# Patient Record
Sex: Female | Born: 2000 | Race: White | Hispanic: No | Marital: Single
Health system: Midwestern US, Community
[De-identification: ages and names within clinical notes are randomized; demographics above are authoritative.]

## PROBLEM LIST (undated history)

## (undated) DIAGNOSIS — J45909 Unspecified asthma, uncomplicated: Secondary | ICD-10-CM

## (undated) DIAGNOSIS — G47419 Narcolepsy without cataplexy: Secondary | ICD-10-CM

## (undated) DIAGNOSIS — D649 Anemia, unspecified: Secondary | ICD-10-CM

## (undated) HISTORY — PX: WISDOM TOOTH EXTRACTION: SHX21

---
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
## 2020-03-29 ENCOUNTER — Encounter (HOSPITAL_COMMUNITY): Payer: Self-pay | Admitting: Obstetrics & Gynecology

## 2020-03-29 ENCOUNTER — Inpatient Hospital Stay (HOSPITAL_COMMUNITY): Payer: Self-pay

## 2020-03-29 ENCOUNTER — Inpatient Hospital Stay (HOSPITAL_COMMUNITY)
Admission: AD | Admit: 2020-03-29 | Discharge: 2020-03-29 | Disposition: A | Discharge status: Home / Self Care | Payer: Self-pay | Attending: Obstetrics & Gynecology | Admitting: Obstetrics & Gynecology

## 2020-03-29 ENCOUNTER — Other Ambulatory Visit: Payer: Self-pay

## 2020-03-29 DIAGNOSIS — Z3A01 Less than 8 weeks gestation of pregnancy: Secondary | ICD-10-CM

## 2020-03-29 DIAGNOSIS — O034 Incomplete spontaneous abortion without complication: Secondary | ICD-10-CM

## 2020-03-29 DIAGNOSIS — O4690 Antepartum hemorrhage, unspecified, unspecified trimester: Secondary | ICD-10-CM

## 2020-03-29 HISTORY — DX: Anemia, unspecified: D64.9

## 2020-03-29 HISTORY — DX: Unspecified asthma, uncomplicated: J45.909

## 2020-03-29 HISTORY — DX: Narcolepsy without cataplexy: G47.419

## 2020-03-29 LAB — CBC
HCT: 36.6 % (ref 36.0–46.0)
Hemoglobin: 12.3 g/dL (ref 12.0–15.0)
MCH: 28.1 pg (ref 26.0–34.0)
MCHC: 33.6 g/dL (ref 30.0–36.0)
MCV: 83.8 fL (ref 80.0–100.0)
Platelets: 404 10*3/uL — ABNORMAL HIGH (ref 150–400)
RBC: 4.37 MIL/uL (ref 3.87–5.11)
RDW: 12.9 % (ref 11.5–15.5)
WBC: 9.2 10*3/uL (ref 4.0–10.5)
nRBC: 0 % (ref 0.0–0.2)

## 2020-03-29 LAB — URINALYSIS, ROUTINE W REFLEX MICROSCOPIC
Bacteria, UA: NONE SEEN
Bilirubin Urine: NEGATIVE
Glucose, UA: NEGATIVE mg/dL
Ketones, ur: NEGATIVE mg/dL
Leukocytes,Ua: NEGATIVE
Nitrite: NEGATIVE
Protein, ur: NEGATIVE mg/dL
RBC / HPF: >50 RBC/hpf — ABNORMAL HIGH (ref 0–5)
Specific Gravity, Urine: 1.012 (ref 1.005–1.030)
pH: 5 (ref 5.0–8.0)

## 2020-03-29 LAB — ABO/RH: ABO/RH(D): B NEG

## 2020-03-29 LAB — POCT PREGNANCY, URINE: Preg Test, Ur: POSITIVE — AB

## 2020-03-29 LAB — WET PREP, GENITAL
Clue Cells Wet Prep HPF POC: NONE SEEN
Sperm: NONE SEEN
Trich, Wet Prep: NONE SEEN
Yeast Wet Prep HPF POC: NONE SEEN

## 2020-03-29 LAB — HCG, QUANTITATIVE, PREGNANCY: hCG, Beta Chain, Quant, S: 4804 m[IU]/mL — ABNORMAL HIGH (ref <5)

## 2020-03-29 MED ORDER — RHO D IMMUNE GLOBULIN 1500 UNIT/2ML IJ SOSY
300.0000 ug | PREFILLED_SYRINGE | Freq: Once | INTRAMUSCULAR | Status: AC
  Administered 2020-03-29: 300 ug via INTRAMUSCULAR
  Filled 2020-03-29: qty 2

## 2020-03-29 NOTE — MAU Provider Note (Signed)
History     CSN: [id number]  Arrival date and time: 03/29/20 8841   Event Date/Time   First Provider Initiated Contact with Patient 03/29/20 0327      Chief Complaint  Patient presents with  . Vaginal Bleeding   Jeanne [name] is a 20 y.o. G1P0 at [redacted]w[redacted]d by LMP who presents to MAU with complaints of vaginal bleeding. Patient reports vaginal bleeding started occurring 3 days ago. Initially was like pink spotting then yesterday started to become heavier and bright red. Patient reports soaking through 2 panty liners today. Denies passing any clots. She reports lower abdominal cramping is associated with vaginal bleeding, cramping is specific to RLQ. Patient has not been seen during this pregnancy. Reports last IC 4 days ago.    OB History    Gravida  1   Para      Term      Preterm      AB      Living        SAB      IAB      Ectopic      Multiple      Live Births              Past Medical History:  Diagnosis Date  . Anemia   . Asthma   . Narcolepsy     Past Surgical History:  Procedure Laterality Date  . WISDOM TOOTH EXTRACTION      History reviewed. No pertinent family history.  Social History   Tobacco Use  . Smoking status: Never Smoker  . Smokeless tobacco: Never Used  Substance Use Topics  . Alcohol use: Never  . Drug use: Never    Allergies:  Allergies  Allergen Reactions  . Strawberry (Diagnostic)     Medications Prior to Admission  Medication Sig Dispense Refill Last Dose  . amphetamine-dextroamphetamine (ADDERALL XR) 10 MG 24 hr capsule Take 10 mg by mouth daily.   More than a month at Unknown time  . hydrOXYzine (ATARAX/VISTARIL) 10 MG tablet Take 10 mg by mouth 3 (three) times daily as needed.   More than a month at Unknown time    Review of Systems  Constitutional: Negative.   Respiratory: Negative.   Cardiovascular: Negative.   Gastrointestinal: Positive for abdominal pain. Negative for constipation, diarrhea, nausea  and vomiting.  Genitourinary: Positive for vaginal bleeding. Negative for difficulty urinating, dysuria, frequency, hematuria, pelvic pain and urgency.  Musculoskeletal: Negative.   Neurological: Negative.   Psychiatric/Behavioral: Negative.    Physical Exam   Blood pressure 132/72, pulse 97, temperature 98.6 F (37 C), temperature source Oral, resp. rate 17, height 5\' 4"  (1.626 m), weight 83 kg, last menstrual period 01/18/2020, SpO2 100 %.  Physical Exam Exam conducted with a chaperone present.  Constitutional:      Appearance: Normal appearance.  Cardiovascular:     Rate and Rhythm: Normal rate and regular rhythm.  Pulmonary:     Effort: Pulmonary effort is normal. No respiratory distress.     Breath sounds: Normal breath sounds. No wheezing.  Abdominal:     General: There is no distension.     Palpations: Abdomen is soft. There is no mass.     Tenderness: There is abdominal tenderness. There is no guarding.  Genitourinary:    Exam position: Lithotomy position.     Vagina: Bleeding present.     Comments: Pelvic exam: Cervix pink, visually closed with mucous discharge from cervical os, without lesion, small amount of  bright red vaginal bleeding without clots (2 faux swabs), vaginal walls and external genitalia normal Bimanual exam: Cervix 0/long/high, firm, anterior, neg CMT, uterus nontender, nonenlarged, left adnexa without tenderness, enlargement, or mass,  right adnexa with  Tenderness, no enlargement, or mass palpated  Skin:    General: Skin is warm and dry.  Neurological:     Mental Status: She is alert and oriented to person, place, and time.  Psychiatric:        Mood and Affect: Mood normal.        Behavior: Behavior normal.        Thought Content: Thought content normal.     MAU Course  Procedures  MDM Orders Placed This Encounter  Procedures  . Wet prep, genital  . US OB LESS THAN 14 WEEKS WITH OB TRANSVAGINAL  . CBC  . hCG, quantitative, pregnancy  .  Urinalysis, Routine w reflex microscopic Urine, Clean Catch  . Pregnancy, urine POC  . ABO/Rh   Labs and [place] report reviewed:  Results for orders placed or performed during the hospital encounter of 03/29/20 (from the past 24 hour(s))  Pregnancy, urine POC     Status: Abnormal   Collection Time: 03/29/20  3:00 AM  Result Value Ref Range   Preg Test, Ur POSITIVE (A) NEGATIVE  Wet prep, genital     Status: Abnormal   Collection Time: 03/29/20  3:05 AM   Specimen: Cervix  Result Value Ref Range   Yeast Wet Prep HPF POC NONE SEEN NONE SEEN   Trich, Wet Prep NONE SEEN NONE SEEN   Clue Cells Wet Prep HPF POC NONE SEEN NONE SEEN   WBC, Wet Prep HPF POC MODERATE (A) NONE SEEN   Sperm NONE SEEN   Urinalysis, Routine w reflex microscopic     Status: Abnormal   Collection Time: 03/29/20  3:21 AM  Result Value Ref Range   Color, Urine STRAW (A) YELLOW   APPearance CLEAR CLEAR   Specific Gravity, Urine 1.012 1.005 - 1.030   pH 5.0 5.0 - 8.0   Glucose, UA NEGATIVE NEGATIVE mg/dL   Hgb urine dipstick LARGE (A) NEGATIVE   Bilirubin Urine NEGATIVE NEGATIVE   Ketones, ur NEGATIVE NEGATIVE mg/dL   Protein, ur NEGATIVE NEGATIVE mg/dL   Nitrite NEGATIVE NEGATIVE   Leukocytes,Ua NEGATIVE NEGATIVE   RBC / HPF >50 (H) 0 - 5 RBC/hpf   WBC, UA 0-5 0 - 5 WBC/hpf   Bacteria, UA NONE SEEN NONE SEEN   Squamous Epithelial / LPF 0-5 0 - 5  CBC     Status: Abnormal   Collection Time: 03/29/20  3:26 AM  Result Value Ref Range   WBC 9.2 4.0 - 10.5 K/uL   RBC 4.37 3.87 - 5.11 MIL/uL   Hemoglobin 12.3 12.0 - 15.0 g/dL   HCT 31.4 38.8 - 87.5 %   MCV 83.8 80.0 - 100.0 fL   MCH 28.1 26.0 - 34.0 pg   MCHC 33.6 30.0 - 36.0 g/dL   RDW 79.7 28.2 - 06.0 %   Platelets 404 (H) 150 - 400 K/uL   nRBC 0.0 0.0 - 0.2 %  ABO/Rh     Status: None   Collection Time: 03/29/20  3:26 AM  Result Value Ref Range   ABO/RH(D) B NEG   hCG, quantitative, pregnancy     Status: Abnormal   Collection Time: 03/29/20  3:26 AM   Result Value Ref Range   hCG, Beta Chain, Quant, S 4,804 (H) <5 mIU/mL  Rh IG workup (includes ABO/Rh)     Status: None (Preliminary result)   Collection Time: 03/29/20  3:26 AM  Result Value Ref Range   Gestational Age(Wks) 10    ABO/RH(D) B NEG    Antibody Screen NEG    Unit Number [id number]    Blood Component Type RHIG    Unit division 00    Status of Unit ISSUED    Transfusion Status      OK TO TRANSFUSE Performed at [facility], [address] [place], [place] [address]    US OB LESS THAN 14 WEEKS WITH OB TRANSVAGINAL  Result Date: 03/29/2020 CLINICAL DATA:  Vaginal bleeding EXAM: OBSTETRIC <14 WK US AND TRANSVAGINAL OB US TECHNIQUE: Both transabdominal and transvaginal ultrasound examinations were performed for complete evaluation of the gestation as well as the maternal uterus, adnexal regions, and pelvic cul-de-sac. Transvaginal technique was performed to assess early pregnancy. COMPARISON:  None. FINDINGS: Intrauterine gestational sac: Single Yolk sac:  Not Visualized. Embryo:  Visualized. Cardiac Activity: Not Visualized. CRL:  13.7 mm   7 w   4 d Subchorionic hemorrhage:  None visualized. Maternal uterus/adnexae: Unremarkable IMPRESSION: Single intrauterine pregnancy measuring 7 weeks 4 days (13.407mm CRL). No cardiac activity. Findings meet criteria for failed pregnancy. This follows SRU consensus guidelines: Diagnostic Criteria for Nonviable Pregnancy Early in the First Trimester. Macy Mis Engl J Med [id number];369:1443-51. Electronically Signed   By: Marnee SpringJonathon  Watts M.D.   On: 03/29/2020 04:14   Discussed results of ultrasound and lab work with patient. Diagnosed with incomplete miscarriage today with patient and significant other. Given time for both to grieve then discussed options. Discussed expectant management vs medical management vs surgical management.   Patient request expectant management. Discussed with patient warning signs and reasons to present back to MAU including  fever, heavy vaginal bleeding soaking through pads each hour for at least 2 hours, lightheaded or dizziness. Discussed with patient that follow up appointment will be scheduled in 1 week and if fetus is not passed at that time then medical management with cytotec would be recommended. Patient verbalizes understanding.   RHogam given in MAU prior to discharge.   Meds ordered this encounter  Medications  . rho (d) immune globulin (RHIG/RHOPHYLAC) injection 300 mcg   Discussed reasons to return to MAU. Follow up in the office- message sent to office for scheduling. Return to MAU as needed. Pt stable at time of discharge.   Assessment and Plan   1. Incomplete miscarriage   2. Vaginal bleeding during pregnancy   3. [redacted] weeks gestation of pregnancy    Discharge home Follow up as scheduled in the office Return to MAU as needed for reasons discussed and/or emergencies    Follow-up Information    [facility]. Schedule an appointment as soon as possible for a visit in 1 week(s).   Specialty: Obstetrics and Gynecology Why: Make appointment to be seen in 1 week for follow up  Contact information: [phone] [place] [phone]             Allergies as of 03/29/2020      Reactions   Strawberry (diagnostic)       Medication List    TAKE these medications   amphetamine-dextroamphetamine 10 MG 24 hr capsule Commonly known as: ADDERALL XR Take 10 mg by mouth daily.   hydrOXYzine 10 MG tablet Commonly known as: ATARAX/VISTARIL Take 10 mg by mouth  3 (three) times daily as needed.       Sharyon Cable CNM 03/29/2020, 5:17 AM

## 2020-03-29 NOTE — MAU Note (Signed)
Patient signed printed AVS. Placed in chart. 

## 2020-03-29 NOTE — MAU Note (Signed)
...  Jeanne [name] is a 20 y.o. at approximately [redacted]w[redacted]d here in MAU reporting: vaginal bleeding that began yesterday, 03/28/2020, around 0230. She reports the bleeding is currently light but was heavier yesterday. No clots. Patient reports slight lower middle abdominal discomfort that she describes as "when you eat something bad" that has been intermittent but no pain at this time. Last intercourse 4 days ago.  +UPT LMP: 01/18/2020

## 2020-03-30 LAB — RH IG WORKUP (INCLUDES ABO/RH)
ABO/RH(D): B NEG
Antibody Screen: NEGATIVE
Gestational Age(Wks): 10
Unit division: 0

## 2020-03-30 LAB — GC/CHLAMYDIA PROBE AMP (~~LOC~~) NOT AT ARMC
Chlamydia: NEGATIVE
Comment: NEGATIVE
Comment: NORMAL
Neisseria Gonorrhea: POSITIVE — AB

## 2020-04-01 ENCOUNTER — Other Ambulatory Visit: Payer: Self-pay

## 2020-04-01 ENCOUNTER — Ambulatory Visit (INDEPENDENT_AMBULATORY_CARE_PROVIDER_SITE_OTHER): Payer: Self-pay

## 2020-04-01 VITALS — BP 112/78 | HR 89

## 2020-04-01 DIAGNOSIS — A549 Gonococcal infection, unspecified: Secondary | ICD-10-CM

## 2020-04-01 MED ORDER — CEFTRIAXONE SODIUM 500 MG IJ SOLR
500.0000 mg | Freq: Once | INTRAMUSCULAR | Status: AC
  Administered 2020-04-01: 500 mg via INTRAMUSCULAR

## 2020-04-01 NOTE — Progress Notes (Signed)
Pt here today for treatment for gonorrhea.  Rocephin 500 mg IM.  Pt tolerated well. Pt reports that her partner was not yet treated.  I advised the pt that she can have her partner to go to [facility] for treatment and to make sure that she abstains from intercourse for two weeks after they both have been treated.  Pt verbalized understanding.    Addison Naegeli, RN  04/01/20

## 2020-04-02 NOTE — Progress Notes (Signed)
Patient was assessed and managed by nursing staff during this encounter. I have reviewed the chart and agree with the documentation and plan. I have also made any necessary editorial changes.  [place] Bing, MD 04/02/2020 7:58 AM

## 2020-04-09 ENCOUNTER — Ambulatory Visit (INDEPENDENT_AMBULATORY_CARE_PROVIDER_SITE_OTHER): Payer: Self-pay | Admitting: Advanced Practice Midwife

## 2020-04-09 ENCOUNTER — Encounter: Payer: Self-pay | Admitting: Advanced Practice Midwife

## 2020-04-09 ENCOUNTER — Other Ambulatory Visit: Payer: Self-pay

## 2020-04-09 VITALS — BP 130/71 | HR 86 | Ht 64.0 in | Wt 179.0 lb

## 2020-04-09 DIAGNOSIS — O039 Complete or unspecified spontaneous abortion without complication: Secondary | ICD-10-CM

## 2020-04-09 NOTE — Progress Notes (Signed)
OB PROBLEM CARE ENCOUNTER NOTE  History:     Jeanne [name], prefers to be called "Jeanne [name]". She is a 20 y.o. G1P0 female here for evaluation following diagnosis of incomplete miscarriage in MAU 03/29/2020.  Current complaints: none.   Patient elected for expectant management and reports light but consistent bleeding through the end of February. Denies abnormal vaginal bleeding, discharge, pelvic pain, problems with intercourse or other gynecologic concerns.    Gynecologic History Patient's last menstrual period was 01/18/2020 (exact date). Contraception: none Last Pap: N/A age 67 Last mammogram: N/A age 14  Obstetric History OB History  Gravida Para Term Preterm AB Living  1            SAB IAB Ectopic Multiple Live Births               # Outcome Date GA Lbr Len/2nd Weight Sex Delivery Anes PTL Lv  1 Gravida             Past Medical History:  Diagnosis Date  . Anemia   . Asthma   . Narcolepsy     Past Surgical History:  Procedure Laterality Date  . WISDOM TOOTH EXTRACTION      Current Outpatient Medications on File Prior to Visit  Medication Sig Dispense Refill  . hydrOXYzine (ATARAX/VISTARIL) 10 MG tablet Take 10 mg by mouth 3 (three) times daily as needed.    Marland Kitchen amphetamine-dextroamphetamine (ADDERALL XR) 10 MG 24 hr capsule Take 10 mg by mouth daily. (Patient not taking: Reported on 04/09/2020)     No current facility-administered medications on file prior to visit.    Allergies  Allergen Reactions  . Strawberry (Diagnostic)     Social History:  reports that she has never smoked. She has never used smokeless tobacco. She reports that she does not drink alcohol and does not use drugs.  No family history on file.  The following portions of the patient's history were reviewed and updated as appropriate: allergies, current medications, past family history, past medical history, past social history, past surgical history and problem list.  Review of  Systems Pertinent items noted in HPI and remainder of comprehensive ROS otherwise negative.  Physical Exam:  BP 130/71   Pulse 86   Ht 5\' 4"  (1.626 m)   Wt 179 lb (81.2 kg)   LMP 01/18/2020 (Exact Date)   BMI 30.73 kg/m  CONSTITUTIONAL: Well-developed, well-nourished female in no acute distress.  HENT:  Normocephalic, atraumatic, External right and left ear normal.  EYES: Conjunctivae and EOM are normal. Pupils are equal, round, and reactive to light. No scleral icterus.  SKIN: Skin is warm and dry.  NEUROLOGIC: Alert and oriented to person, place, and time. PSYCHIATRIC: Normal mood and affect. Normal behavior. Normal judgment and thought content. CARDIOVASCULAR: Normal heart rate noted RESPIRATORY: Effort normal  Assessment and Plan:    1. Miscarriage - Quant hCG 4804 on 03/29/2020 - Dx at 7w 5d - Pt elected Expectant Management and has not taken Cytotec - Will assess for Cytotec once quant hCG results are received - Beta hCG quant (ref lab)  2. Contraception Counseling - Previously on OCPs - Reviewed other options for contraception once Quant hCG is received - Strongly encouraged consideration of LARCs - [employer]eds[employer]org utilized as reference during counseling discussion  Routine preventative health maintenance measures emphasized. Please refer to After Visit Summary for other counseling recommendations.   Total visit time: 30 minutes. Greater than 50% of visit spent in counseling and coordination of  care.  Clayton Bibles, MSN, CNM Certified Nurse Midwife, [facility], [facility] 04/09/20 8:16 PM

## 2020-04-09 NOTE — Patient Instructions (Signed)
[facility]  Contraception Choices Contraception refers to things you do or use to prevent pregnancy. It is also called birth control. There are several methods of birth control. Talk to your doctor about the best method for you. Hormonal birth control This kind of birth control uses hormones. Here are some types of hormonal birth control:  A tube that is put under the skin of your arm (implant). The tube can stay in for up to 3 years.  Shots you get every 3 months.  Pills you take every day.  A patch you change 1 time each week for 3 weeks. After that, the patch is taken off for 1 week.  A ring you put in the vagina. The ring is left in for 3 weeks. Then it is taken out of the vagina for 1 week. Then a new ring is put in.  Pills you take after unprotected sex. These are called emergency birth control pills.   Barrier birth control Here are some types of barrier birth control:  A thin covering that is put on the penis before sex (female condom). The covering is thrown away after sex.  A soft, loose covering that is put in the vagina before sex (female condom). The covering is thrown away after sex.  A rubber bowl that sits over the cervix (diaphragm). The bowl must be made for you. The bowl is put into the vagina before sex. The bowl is left in for 6-8 hours after sex. It is taken out within 24 hours.  A small, soft cup that fits over the cervix (cervical cap). The cup must be made for you. The cup should be left in for 6-8 hours after sex. It is taken out within 48 hours.  A sponge that is put into the vagina before sex. It must be left in for at least 6 hours after sex. It must be taken out within 30 hours and thrown away.  A chemical that kills or stops sperm from getting into the womb (uterus). This chemical is called a spermicide. It may be a pill, cream, jelly, or foam to put in the vagina. The chemical should be used at least 10-15 minutes before sex.   IUD birth control IUD  means "intrauterine device." It is put inside the womb. There are two kinds:  Hormone IUD. This kind can stay in the womb for 3-5 years.  Copper IUD. This kind can stay in the womb for 10 years. Permanent birth control Here are some types of permanent birth control:  Surgery to block the fallopian tubes.  Having an insert put into each fallopian tube. This method takes 3 months to work. Other forms of birth control must be used for 3 months.  Surgery to tie off the tubes that carry sperm in men (vasectomy). This method takes 3 months to work. Other forms of birth control must be used for 3 months. Natural planning birth control Here are some types of natural planning birth control:  Not having sex on the days the woman could get pregnant.  Using a calendar: ? To keep track of the length of each menstrual cycle. ? To find out what days pregnancy can happen. ? To plan to not have sex on days when pregnancy can happen.  Watching for signs of ovulation and not having sex during this time. One way the woman can check for ovulation is to check her temperature.  Waiting to have sex until after ovulation. Where to find more information  Centers for Disease Control and Prevention: [employer].br Summary  Contraception, also called birth control, refers to things you do or use to prevent pregnancy.  Hormonal methods of birth control include implants, injections, pills, patches, vaginal rings, and emergency birth control pills.  Barrier methods of birth control can include female condoms, female condoms, diaphragms, cervical caps, sponges, and spermicides.  There are two types of IUD (intrauterine device) birth control. An IUD can be put in a woman's womb to prevent pregnancy for several years.  Permanent birth control can be done through a procedure for males, females, or both. Natural planning means not having sex when the woman could get pregnant. This information is not intended to  replace advice given to you by your health care provider. Make sure you discuss any questions you have with your health care provider. Document Revised: 07/01/2019 Document Reviewed: 07/01/2019 Elsevier Patient Education  2021 [employer].

## 2020-04-10 LAB — BETA HCG QUANT (REF LAB): hCG Quant: 5 m[IU]/mL

## 2020-04-13 ENCOUNTER — Telehealth: Payer: Self-pay

## 2020-04-13 NOTE — Telephone Encounter (Signed)
Calvert Cantor, CNM  P [facility] Can you please notify patient her Sharene Butters hcg is 5 s/p miscarriage, no need to use Cytotec, can start contraception whenever she wants. If she wants an OCP I can send that in for her today. Thanks! SW    Called pt with results. Pt states she would like OCPs. Pharmacy verified. Provider notified to send in prescription.

## 2020-12-18 ENCOUNTER — Ambulatory Visit: Payer: 59 | Admitting: Nurse Practitioner

## 2020-12-18 ENCOUNTER — Encounter: Payer: Self-pay | Admitting: Nurse Practitioner

## 2020-12-18 ENCOUNTER — Other Ambulatory Visit: Payer: Self-pay

## 2020-12-18 VITALS — BP 125/73 | HR 96 | Temp 97.7°F | Resp 20 | Ht 64.0 in | Wt 201.0 lb

## 2020-12-18 DIAGNOSIS — Z3201 Encounter for pregnancy test, result positive: Secondary | ICD-10-CM | POA: Diagnosis not present

## 2020-12-18 DIAGNOSIS — Z3A01 Less than 8 weeks gestation of pregnancy: Secondary | ICD-10-CM | POA: Insufficient documentation

## 2020-12-18 NOTE — Patient Instructions (Signed)
First Trimester of Pregnancy °The first trimester of pregnancy starts on the first day of your last menstrual period until the end of week 12. This is months 1 through 3 of pregnancy. A week after a sperm fertilizes an egg, the egg will implant into the wall of the uterus and begin to develop into a baby. By the end of 12 weeks, all the baby's organs will be formed and the baby will be 2-3 inches in size. °Body changes during your first trimester °Your body goes through many changes during pregnancy. The changes vary and generally return to normal after your baby is born. °Physical changes °You may gain or lose weight. °Your breasts may begin to grow larger and become tender. The tissue that surrounds your nipples (areola) may become darker. °Dark spots or blotches (chloasma or mask of pregnancy) may develop on your face. °You may have changes in your hair. These can include thickening or thinning of your hair or changes in texture. °Health changes °You may feel nauseous, and you may vomit. °You may have heartburn. °You may develop headaches. °You may develop constipation. °Your gums may bleed and may be sensitive to brushing and flossing. °Other changes °You may tire easily. °You may urinate more often. °Your menstrual periods will stop. °You may have a loss of appetite. °You may develop cravings for certain kinds of food. °You may have changes in your emotions from day to day. °You may have more vivid and strange dreams. °Follow these instructions at home: °Medicines °Follow your health care provider's instructions regarding medicine use. Specific medicines may be either safe or unsafe to take during pregnancy. Do not take any medicines unless told to by your health care provider. °Take a prenatal vitamin that contains at least 600 micrograms (mcg) of folic acid. °Eating and drinking °Eat a healthy diet that includes fresh fruits and vegetables, whole grains, good sources of protein such as meat, eggs, or tofu,  and low-fat dairy products. °Avoid raw meat and unpasteurized juice, milk, and cheese. These carry germs that can harm you and your baby. °If you feel nauseous or you vomit: °Eat 4 or 5 small meals a day instead of 3 large meals. °Try eating a few soda crackers. °Drink liquids between meals instead of during meals. °You may need to take these actions to prevent or treat constipation: °Drink enough fluid to keep your urine pale yellow. °Eat foods that are high in fiber, such as beans, whole grains, and fresh fruits and vegetables. °Limit foods that are high in fat and processed sugars, such as fried or sweet foods. °Activity °Exercise only as directed by your health care provider. Most people can continue their usual exercise routine during pregnancy. Try to exercise for 30 minutes at least 5 days a week. °Stop exercising if you develop pain or cramping in the lower abdomen or lower back. °Avoid exercising if it is very hot or humid or if you are at high altitude. °Avoid heavy lifting. °If you choose to, you may have sex unless your health care provider tells you not to. °Relieving pain and discomfort °Wear a good support bra to relieve breast tenderness. °Rest with your legs elevated if you have leg cramps or low back pain. °If you develop bulging veins (varicose veins) in your legs: °Wear support hose as told by your health care provider. °Elevate your feet for 15 minutes, 3-4 times a day. °Limit salt in your diet. °Safety °Wear your seat belt at all times when driving   or riding in a car. °Talk with your health care provider if someone is verbally or physically abusive to you. °Talk with your health care provider if you are feeling sad or have thoughts of hurting yourself. °Lifestyle °Do not use hot tubs, steam rooms, or saunas. °Do not douche. Do not use tampons or scented sanitary pads. °Do not use herbal remedies, alcohol, illegal drugs, or medicines that are not approved by your health care provider. Chemicals  in these products can harm your baby. °Do not use any products that contain nicotine or tobacco, such as cigarettes, e-cigarettes, and chewing tobacco. If you need help quitting, ask your health care provider. °Avoid cat litter boxes and soil used by cats. These carry germs that can cause birth defects in the baby and possibly loss of the unborn baby (fetus) by miscarriage or stillbirth. °General instructions °During routine prenatal visits in the first trimester, your health care provider will do a physical exam, perform necessary tests, and ask you how things are going. Keep all follow-up visits. This is important. °Ask for help if you have counseling or nutritional needs during pregnancy. Your health care provider can offer advice or refer you to specialists for help with various needs. °Schedule a dentist appointment. At home, brush your teeth with a soft toothbrush. Floss gently. °Write down your questions. Take them to your prenatal visits. °Where to find more information °American Pregnancy Association: americanpregnancy.org °American College of Obstetricians and Gynecologists: acog.org/en/Womens%20Health/Pregnancy °Office on Women's Health: womenshealth.gov/pregnancy °Contact a health care provider if you have: °Dizziness. °A fever. °Mild pelvic cramps, pelvic pressure, or nagging pain in the abdominal area. °Nausea, vomiting, or diarrhea that lasts for 24 hours or longer. °A bad-smelling vaginal discharge. °Pain when you urinate. °Known exposure to a contagious illness, such as chickenpox, measles, Zika virus, HIV, or hepatitis. °Get help right away if you have: °Spotting or bleeding from your vagina. °Severe abdominal cramping or pain. °Shortness of breath or chest pain. °Any kind of trauma, such as from a fall or a car crash. °New or increased pain, swelling, or redness in an arm or leg. °Summary °The first trimester of pregnancy starts on the first day of your last menstrual period until the end of week  12 (months 1 through 3). °Eating 4 or 5 small meals a day rather than 3 large meals may help to relieve nausea and vomiting. °Do not use any products that contain nicotine or tobacco, such as cigarettes, e-cigarettes, and chewing tobacco. If you need help quitting, ask your health care provider. °Keep all follow-up visits. This is important. °This information is not intended to replace advice given to you by your health care provider. Make sure you discuss any questions you have with your health care provider. °Document Revised: 07/03/2019 Document Reviewed: 05/09/2019 °Elsevier Patient Education © 2022 [employer] Inc. ° °

## 2020-12-18 NOTE — Progress Notes (Signed)
New Patient Note  RE: Jeanne [name] MRN: [id number] DOB: [birth year] Date of Office Visit: 12/18/2020  Chief Complaint: Establish Care (NEW - pregnant )  History of Present Illness: Patient is a 20 year old female who presents to clinic to establish care.  Patient is newly pregnant less than 8 weeks.  With a history of miscarriage February 2022.  Patient is not reporting any headaches, nausea, vomiting or abdominal pain.  Patient had questions about Rhophylac administration during pregnancy.  Education provided to patient and referral to OB/GYN.  Assessment and Plan: Jeanne [name] is a 20 y.o. female with: Less than [redacted] weeks gestation of pregnancy Assessment completed for early pregnancy.  Completed lab work CBC, CMP, and hCG.  Results pending.  I provided education to patient on signs and symptoms of miscarriage as patient had a history of miscarriage February 2022.  I provided education to patient and spouse for questions about Rhophylac administration.  I completed referral to OB/GYN. Printed handouts given to patient.  Patient knows to follow-up with any unusual symptoms, pain, fever and bleeding.  Return if symptoms worsen or fail to improve.   Diagnostics:   Past Medical History: Patient Active Problem List   Diagnosis Date Noted   Less than [redacted] weeks gestation of pregnancy 12/18/2020   Past Medical History:  Diagnosis Date   Anemia    Asthma    Narcolepsy    Past Surgical History: Past Surgical History:  Procedure Laterality Date   WISDOM TOOTH EXTRACTION     Medication List:  No current outpatient medications on file.   No current facility-administered medications for this visit.   Allergies: Allergies  Allergen Reactions   Strawberry (Diagnostic)    Social History: Social History   Socioeconomic History   Marital status: Married    Spouse name: Dylan   Number of children: Not on file   Years of education: Not on file   Highest education level: Not on file   Occupational History   Not on file  Tobacco Use   Smoking status: Never   Smokeless tobacco: Never  Vaping Use   Vaping Use: Never used  Substance and Sexual Activity   Alcohol use: Never   Drug use: Never   Sexual activity: Yes  Other Topics Concern   Not on file  Social History Narrative   Married - lives in [place]    Social Determinants of Health   Financial Resource Strain: Not on file  Food Insecurity: No Food Insecurity   Worried About Programme researcher, broadcasting/film/video in the Last Year: Never true   Barista in the Last Year: Never true  Transportation Needs: No Transportation Needs   Lack of Transportation (Medical): No   Lack of Transportation (Non-Medical): No  Physical Activity: Not on file  Stress: Not on file  Social Connections: Not on file       Family History: Family History  Problem Relation Age of Onset   Schizophrenia Brother          Review of Systems  Constitutional: Negative.   HENT: Negative.    Respiratory: Negative.    Cardiovascular: Negative.   Gastrointestinal: Negative.   Musculoskeletal: Negative.   Skin:  Negative for rash.  Neurological:  Negative for facial asymmetry and headaches.  Psychiatric/Behavioral:  The patient is not nervous/anxious.   All other systems reviewed and are negative.  Objective: BP 125/73   Pulse 96   Temp 97.7 F (36.5 C)   Resp 20  Ht 5\' 4"  (1.626 m)   Wt 201 lb (91.2 kg)   LMP 11/04/2020 (Exact Date)   SpO2 97%   BMI 34.50 kg/m  Body mass index is 34.5 kg/m.   Physical Exam Vitals and nursing note reviewed.  Constitutional:      Appearance: Normal appearance. She is normal weight.  HENT:     Head: Normocephalic.     Right Ear: Ear canal and external ear normal.     Left Ear: Ear canal and external ear normal.     Mouth/Throat:     Mouth: Mucous membranes are moist.     Pharynx: Oropharynx is clear.  Eyes:     Conjunctiva/sclera: Conjunctivae normal.  Pulmonary:     Effort: Pulmonary  effort is normal.     Breath sounds: Normal breath sounds.  Abdominal:     General: Bowel sounds are normal.  Musculoskeletal:        General: Normal range of motion.  Skin:    General: Skin is warm.     Findings: No rash.  Neurological:     Mental Status: She is alert and oriented to person, place, and time.  Psychiatric:        Mood and Affect: Mood normal.        Behavior: Behavior normal.   The plan was reviewed with the patient/family, and all questions/concerned were addressed.  It was my pleasure to see Jeanne [name] today and participate in her care. Please feel free to contact me with any questions or concerns.  Sincerely,  11/06/2020 NP [facility]

## 2020-12-18 NOTE — Assessment & Plan Note (Signed)
Assessment completed for early pregnancy.  Completed lab work CBC, CMP, and hCG.  Results pending.  I provided education to patient on signs and symptoms of miscarriage as patient had a history of miscarriage February 2022.  I provided education to patient and spouse for questions about Rhophylac administration.  I completed referral to OB/GYN. Printed handouts given to patient.  Patient knows to follow-up with any unusual symptoms, pain, fever and bleeding.

## 2020-12-19 LAB — CBC WITH DIFFERENTIAL/PLATELET
Basophils Absolute: 0.1 10*3/uL (ref 0.0–0.2)
Basos: 1 %
EOS (ABSOLUTE): 0.1 10*3/uL (ref 0.0–0.4)
Eos: 1 %
Hematocrit: 36.6 % (ref 34.0–46.6)
Hemoglobin: 11.9 g/dL (ref 11.1–15.9)
Immature Grans (Abs): 0 10*3/uL (ref 0.0–0.1)
Immature Granulocytes: 0 %
Lymphocytes Absolute: 2.4 10*3/uL (ref 0.7–3.1)
Lymphs: 22 %
MCH: 26.8 pg (ref 26.6–33.0)
MCHC: 32.5 g/dL (ref 31.5–35.7)
MCV: 82 fL (ref 79–97)
Monocytes Absolute: 0.7 10*3/uL (ref 0.1–0.9)
Monocytes: 6 %
Neutrophils Absolute: 7.7 10*3/uL — ABNORMAL HIGH (ref 1.4–7.0)
Neutrophils: 70 %
Platelets: 426 10*3/uL (ref 150–450)
RBC: 4.44 x10E6/uL (ref 3.77–5.28)
RDW: 14 % (ref 11.7–15.4)
WBC: 11 10*3/uL — ABNORMAL HIGH (ref 3.4–10.8)

## 2020-12-19 LAB — COMPREHENSIVE METABOLIC PANEL
ALT: 20 IU/L (ref 0–32)
AST: 18 IU/L (ref 0–40)
Albumin/Globulin Ratio: 2 (ref 1.2–2.2)
Albumin: 4.3 g/dL (ref 3.9–5.0)
Alkaline Phosphatase: 88 IU/L (ref 44–121)
BUN/Creatinine Ratio: 12 (ref 9–23)
BUN: 8 mg/dL (ref 6–20)
Bilirubin Total: <0.2 mg/dL (ref 0.0–1.2)
CO2: 19 mmol/L — ABNORMAL LOW (ref 20–29)
Calcium: 9.1 mg/dL (ref 8.7–10.2)
Chloride: 101 mmol/L (ref 96–106)
Creatinine, Ser: 0.67 mg/dL (ref 0.57–1.00)
Globulin, Total: 2.2 g/dL (ref 1.5–4.5)
Glucose: 170 mg/dL — ABNORMAL HIGH (ref 70–99)
Potassium: 4.2 mmol/L (ref 3.5–5.2)
Sodium: 136 mmol/L (ref 134–144)
Total Protein: 6.5 g/dL (ref 6.0–8.5)
eGFR: 127 mL/min/{1.73_m2} (ref >59)

## 2020-12-19 LAB — HCG, SERUM, QUALITATIVE: hCG,Beta Subunit,Qual,Serum: POSITIVE m[IU]/mL — AB (ref <6)

## 2020-12-27 DIAGNOSIS — J45909 Unspecified asthma, uncomplicated: Secondary | ICD-10-CM | POA: Insufficient documentation

## 2020-12-27 DIAGNOSIS — G47419 Narcolepsy without cataplexy: Secondary | ICD-10-CM | POA: Insufficient documentation

## 2021-01-06 LAB — OB RESULTS CONSOLE RPR: RPR: NONREACTIVE

## 2021-01-06 LAB — OB RESULTS CONSOLE RUBELLA ANTIBODY, IGM: Rubella: NON-IMMUNE/NOT IMMUNE

## 2021-01-06 LAB — HEPATITIS C ANTIBODY: HCV Ab: NEGATIVE

## 2021-01-06 LAB — OB RESULTS CONSOLE HIV ANTIBODY (ROUTINE TESTING): HIV: NONREACTIVE

## 2021-01-06 LAB — OB RESULTS CONSOLE HEPATITIS B SURFACE ANTIGEN: Hepatitis B Surface Ag: NEGATIVE

## 2021-01-19 LAB — OB RESULTS CONSOLE GC/CHLAMYDIA
Chlamydia: NEGATIVE
Neisseria Gonorrhea: NEGATIVE

## 2021-01-20 LAB — HM PAP SMEAR

## 2021-06-16 ENCOUNTER — Encounter: Payer: 59 | Attending: Obstetrics and Gynecology | Admitting: Registered"

## 2021-06-16 DIAGNOSIS — Z3A00 Weeks of gestation of pregnancy not specified: Secondary | ICD-10-CM | POA: Diagnosis not present

## 2021-06-16 DIAGNOSIS — O24419 Gestational diabetes mellitus in pregnancy, unspecified control: Secondary | ICD-10-CM | POA: Insufficient documentation

## 2021-06-18 ENCOUNTER — Encounter: Payer: Self-pay | Admitting: Registered"

## 2021-06-18 DIAGNOSIS — O24419 Gestational diabetes mellitus in pregnancy, unspecified control: Secondary | ICD-10-CM | POA: Insufficient documentation

## 2021-06-18 NOTE — Progress Notes (Signed)
Patient was seen on 06/16/21 for Gestational Diabetes self-management class at the [facility]. The following learning objectives were met by the patient during this course: ? ?States the definition of Gestational Diabetes ?States why dietary management is important in controlling blood glucose ?Describes the effects each nutrient has on blood glucose levels ?Demonstrates ability to create a balanced meal plan ?Demonstrates carbohydrate counting  ?States when to check blood glucose levels ?Demonstrates proper blood glucose monitoring techniques ?States the effect of stress and exercise on blood glucose levels ?States the importance of limiting caffeine and abstaining from alcohol and smoking ? ?Blood glucose monitor given: none. Sample meters out of stock. Used demo to teach how to use Contour Next. ? ?Patient instructed to monitor glucose levels: ?FBS: 60 - <95; 1 hour: <140; 2 hour: <120 ? ?Patient received handouts: ?Nutrition Diabetes and Pregnancy, including carb counting list ? ?Patient will be seen for follow-up as needed. ?

## 2021-07-23 LAB — OB RESULTS CONSOLE GBS: GBS: POSITIVE

## 2021-08-12 ENCOUNTER — Other Ambulatory Visit (HOSPITAL_COMMUNITY): Payer: Self-pay

## 2021-08-13 ENCOUNTER — Inpatient Hospital Stay (HOSPITAL_COMMUNITY): Payer: 59

## 2021-08-13 ENCOUNTER — Inpatient Hospital Stay (HOSPITAL_COMMUNITY)
Admission: AD | Admit: 2021-08-13 | Discharge: 2021-08-16 | DRG: 788 | Disposition: A | Discharge status: Home / Self Care | Payer: 59 | Attending: Obstetrics and Gynecology | Admitting: Obstetrics and Gynecology

## 2021-08-13 ENCOUNTER — Other Ambulatory Visit: Payer: Self-pay

## 2021-08-13 ENCOUNTER — Inpatient Hospital Stay (HOSPITAL_COMMUNITY): Payer: 59 | Admitting: Anesthesiology

## 2021-08-13 ENCOUNTER — Encounter (HOSPITAL_COMMUNITY)
Admission: AD | Disposition: A | Discharge status: Home / Self Care | Payer: Self-pay | Source: Home / Self Care | Attending: Obstetrics and Gynecology

## 2021-08-13 ENCOUNTER — Encounter (HOSPITAL_COMMUNITY): Payer: Self-pay | Admitting: Obstetrics and Gynecology

## 2021-08-13 DIAGNOSIS — O99824 Streptococcus B carrier state complicating childbirth: Secondary | ICD-10-CM | POA: Diagnosis present

## 2021-08-13 DIAGNOSIS — O322XX0 Maternal care for transverse and oblique lie, not applicable or unspecified: Secondary | ICD-10-CM | POA: Diagnosis present

## 2021-08-13 DIAGNOSIS — O24429 Gestational diabetes mellitus in childbirth, unspecified control: Secondary | ICD-10-CM | POA: Diagnosis not present

## 2021-08-13 DIAGNOSIS — Z3A39 39 weeks gestation of pregnancy: Secondary | ICD-10-CM

## 2021-08-13 DIAGNOSIS — O3662X0 Maternal care for excessive fetal growth, second trimester, not applicable or unspecified: Secondary | ICD-10-CM

## 2021-08-13 DIAGNOSIS — O3663X0 Maternal care for excessive fetal growth, third trimester, not applicable or unspecified: Secondary | ICD-10-CM | POA: Diagnosis present

## 2021-08-13 DIAGNOSIS — O24420 Gestational diabetes mellitus in childbirth, diet controlled: Secondary | ICD-10-CM | POA: Diagnosis present

## 2021-08-13 DIAGNOSIS — Z98891 History of uterine scar from previous surgery: Secondary | ICD-10-CM

## 2021-08-13 DIAGNOSIS — O3660X0 Maternal care for excessive fetal growth, unspecified trimester, not applicable or unspecified: Principal | ICD-10-CM | POA: Diagnosis present

## 2021-08-13 LAB — GLUCOSE, CAPILLARY
Glucose-Capillary: 90 mg/dL (ref 70–99)
Glucose-Capillary: 89 mg/dL (ref 70–99)

## 2021-08-13 LAB — CBC
HCT: 35.7 % — ABNORMAL LOW (ref 36.0–46.0)
Hemoglobin: 11.7 g/dL — ABNORMAL LOW (ref 12.0–15.0)
MCH: 25.5 pg — ABNORMAL LOW (ref 26.0–34.0)
MCHC: 32.8 g/dL (ref 30.0–36.0)
MCV: 77.9 fL — ABNORMAL LOW (ref 80.0–100.0)
Platelets: 340 10*3/uL (ref 150–400)
RBC: 4.58 MIL/uL (ref 3.87–5.11)
RDW: 18 % — ABNORMAL HIGH (ref 11.5–15.5)
WBC: 13.6 10*3/uL — ABNORMAL HIGH (ref 4.0–10.5)
nRBC: 0 % (ref 0.0–0.2)

## 2021-08-13 LAB — TYPE AND SCREEN
ABO/RH(D): B NEG
Antibody Screen: NEGATIVE

## 2021-08-13 LAB — RPR: RPR Ser Ql: NONREACTIVE

## 2021-08-13 SURGERY — Surgical Case
Anesthesia: Epidural | Wound class: Clean Contaminated

## 2021-08-13 MED ORDER — COCONUT OIL OIL: 1.0000 | TOPICAL_OIL | Status: DC | PRN

## 2021-08-13 MED ORDER — DIPHENHYDRAMINE HCL 25 MG PO CAPS: 25.0000 mg | ORAL_CAPSULE | ORAL | Status: DC | PRN

## 2021-08-13 MED ORDER — DIPHENHYDRAMINE HCL 25 MG PO CAPS: 25.0000 mg | ORAL_CAPSULE | Freq: Four times a day (QID) | ORAL | Status: DC | PRN

## 2021-08-13 MED ORDER — AMISULPRIDE (ANTIEMETIC) 5 MG/2ML IV SOLN: 10.0000 mg | Freq: Once | INTRAVENOUS | Status: DC | PRN

## 2021-08-13 MED ORDER — NALOXONE HCL 0.4 MG/ML IJ SOLN: 0.4000 mg | INTRAMUSCULAR | Status: DC | PRN

## 2021-08-13 MED ORDER — LACTATED RINGERS IV SOLN
500.0000 mL | INTRAVENOUS | Status: DC | PRN
  Administered 2021-08-13: 500 mL via INTRAVENOUS

## 2021-08-13 MED ORDER — PRENATAL MULTIVITAMIN CH
1.0000 | ORAL_TABLET | Freq: Every day | ORAL | Status: DC
Start: 2021-08-14 — End: 2021-08-16
  Administered 2021-08-14 – 2021-08-15 (×2): 1 via ORAL
  Filled 2021-08-13 (×2): qty 1

## 2021-08-13 MED ORDER — SENNOSIDES-DOCUSATE SODIUM 8.6-50 MG PO TABS
2.0000 | ORAL_TABLET | Freq: Every day | ORAL | Status: DC
Start: 2021-08-14 — End: 2021-08-16
  Administered 2021-08-14 – 2021-08-16 (×3): 2 via ORAL
  Filled 2021-08-13 (×3): qty 2

## 2021-08-13 MED ORDER — ZOLPIDEM TARTRATE 5 MG PO TABS: 5.0000 mg | ORAL_TABLET | Freq: Every evening | ORAL | Status: DC | PRN

## 2021-08-13 MED ORDER — HYDROMORPHONE HCL 1 MG/ML IJ SOLN: 0.2500 mg | INTRAMUSCULAR | Status: DC | PRN

## 2021-08-13 MED ORDER — OXYCODONE HCL 5 MG PO TABS
5.0000 mg | ORAL_TABLET | ORAL | Status: DC | PRN
  Administered 2021-08-15 – 2021-08-16 (×2): 5 mg via ORAL
  Filled 2021-08-13 (×2): qty 1

## 2021-08-13 MED ORDER — TETANUS-DIPHTH-ACELL PERTUSSIS 5-2.5-18.5 LF-MCG/0.5 IM SUSY: 0.5000 mL | PREFILLED_SYRINGE | Freq: Once | INTRAMUSCULAR | Status: DC

## 2021-08-13 MED ORDER — LIDOCAINE-EPINEPHRINE (PF) 2 %-1:200000 IJ SOLN
INTRAMUSCULAR | Status: AC
  Filled 2021-08-13: qty 20

## 2021-08-13 MED ORDER — EPHEDRINE 5 MG/ML INJ: 10.0000 mg | INTRAVENOUS | Status: DC | PRN

## 2021-08-13 MED ORDER — LACTATED RINGERS IV SOLN: 500.0000 mL | Freq: Once | INTRAVENOUS | Status: DC

## 2021-08-13 MED ORDER — OXYTOCIN-SODIUM CHLORIDE 30-0.9 UT/500ML-% IV SOLN
INTRAVENOUS | Status: AC
  Filled 2021-08-13: qty 500

## 2021-08-13 MED ORDER — ONDANSETRON HCL 4 MG/2ML IJ SOLN
INTRAMUSCULAR | Status: AC
  Filled 2021-08-13: qty 2

## 2021-08-13 MED ORDER — OXYCODONE HCL 5 MG PO TABS: 5.0000 mg | ORAL_TABLET | Freq: Once | ORAL | Status: DC | PRN

## 2021-08-13 MED ORDER — KETOROLAC TROMETHAMINE 30 MG/ML IJ SOLN
30.0000 mg | Freq: Four times a day (QID) | INTRAMUSCULAR | Status: AC | PRN
Start: 2021-08-13 — End: 2021-08-14
  Administered 2021-08-13: 30 mg via INTRAVENOUS

## 2021-08-13 MED ORDER — KETOROLAC TROMETHAMINE 30 MG/ML IJ SOLN
INTRAMUSCULAR | Status: AC
  Filled 2021-08-13: qty 1

## 2021-08-13 MED ORDER — SCOPOLAMINE 1 MG/3DAYS TD PT72
MEDICATED_PATCH | TRANSDERMAL | Status: AC
  Filled 2021-08-13: qty 1

## 2021-08-13 MED ORDER — ONDANSETRON HCL 4 MG/2ML IJ SOLN
4.0000 mg | Freq: Once | INTRAMUSCULAR | Status: DC | PRN
Start: 2021-08-13 — End: 2021-08-13

## 2021-08-13 MED ORDER — LIDOCAINE-EPINEPHRINE (PF) 2 %-1:200000 IJ SOLN
INTRAMUSCULAR | Status: DC | PRN
  Administered 2021-08-13 (×2): 5 mL via EPIDURAL
  Administered 2021-08-13: 10 mL via EPIDURAL

## 2021-08-13 MED ORDER — CEFAZOLIN SODIUM-DEXTROSE 2-4 GM/100ML-% IV SOLN
INTRAVENOUS | Status: AC
  Filled 2021-08-13: qty 100

## 2021-08-13 MED ORDER — FENTANYL CITRATE (PF) 100 MCG/2ML IJ SOLN
INTRAMUSCULAR | Status: AC
  Filled 2021-08-13: qty 2

## 2021-08-13 MED ORDER — ONDANSETRON HCL 4 MG/2ML IJ SOLN: 4.0000 mg | Freq: Three times a day (TID) | INTRAMUSCULAR | Status: DC | PRN

## 2021-08-13 MED ORDER — CEFAZOLIN SODIUM-DEXTROSE 2-3 GM-%(50ML) IV SOLR
INTRAVENOUS | Status: DC | PRN
  Administered 2021-08-13: 2 g via INTRAVENOUS

## 2021-08-13 MED ORDER — DIPHENHYDRAMINE HCL 50 MG/ML IJ SOLN: 12.5000 mg | INTRAMUSCULAR | Status: DC | PRN

## 2021-08-13 MED ORDER — OXYCODONE-ACETAMINOPHEN 5-325 MG PO TABS: 1.0000 | ORAL_TABLET | ORAL | Status: DC | PRN

## 2021-08-13 MED ORDER — MENTHOL 3 MG MT LOZG: 1.0000 | LOZENGE | OROMUCOSAL | Status: DC | PRN

## 2021-08-13 MED ORDER — SCOPOLAMINE 1 MG/3DAYS TD PT72
1.0000 | MEDICATED_PATCH | Freq: Once | TRANSDERMAL | Status: DC
  Administered 2021-08-13: 1.5 mg via TRANSDERMAL

## 2021-08-13 MED ORDER — ONDANSETRON HCL 4 MG/2ML IJ SOLN: 4.0000 mg | Freq: Four times a day (QID) | INTRAMUSCULAR | Status: DC | PRN

## 2021-08-13 MED ORDER — SOD CITRATE-CITRIC ACID 500-334 MG/5ML PO SOLN
30.0000 mL | ORAL | Status: DC | PRN
Start: 2021-08-13 — End: 2021-08-13
  Administered 2021-08-13: 30 mL via ORAL
  Filled 2021-08-13: qty 30

## 2021-08-13 MED ORDER — OXYCODONE HCL 5 MG/5ML PO SOLN: 5.0000 mg | Freq: Once | ORAL | Status: DC | PRN

## 2021-08-13 MED ORDER — ACETAMINOPHEN 325 MG PO TABS: 650.0000 mg | ORAL_TABLET | ORAL | Status: DC | PRN

## 2021-08-13 MED ORDER — NALOXONE HCL 4 MG/10ML IJ SOLN: 1.0000 ug/kg/h | INTRAVENOUS | Status: DC | PRN

## 2021-08-13 MED ORDER — SIMETHICONE 80 MG PO CHEW
80.0000 mg | CHEWABLE_TABLET | Freq: Three times a day (TID) | ORAL | Status: DC
  Administered 2021-08-14 – 2021-08-16 (×7): 80 mg via ORAL
  Filled 2021-08-13 (×7): qty 1

## 2021-08-13 MED ORDER — ONDANSETRON HCL 4 MG/2ML IJ SOLN
INTRAMUSCULAR | Status: DC | PRN
  Administered 2021-08-13: 4 mg via INTRAVENOUS

## 2021-08-13 MED ORDER — OXYCODONE-ACETAMINOPHEN 5-325 MG PO TABS: 2.0000 | ORAL_TABLET | ORAL | Status: DC | PRN

## 2021-08-13 MED ORDER — ACETAMINOPHEN 500 MG PO TABS
1000.0000 mg | ORAL_TABLET | Freq: Four times a day (QID) | ORAL | Status: AC
  Administered 2021-08-13 – 2021-08-14 (×4): 1000 mg via ORAL
  Filled 2021-08-13 (×4): qty 2

## 2021-08-13 MED ORDER — MISOPROSTOL 25 MCG QUARTER TABLET
25.0000 ug | ORAL_TABLET | ORAL | Status: DC | PRN
  Administered 2021-08-13: 25 ug via VAGINAL
  Filled 2021-08-13: qty 1

## 2021-08-13 MED ORDER — MORPHINE SULFATE (PF) 0.5 MG/ML IJ SOLN
INTRAMUSCULAR | Status: DC | PRN
  Administered 2021-08-13: 3 mg via EPIDURAL

## 2021-08-13 MED ORDER — SIMETHICONE 80 MG PO CHEW: 80.0000 mg | CHEWABLE_TABLET | ORAL | Status: DC | PRN

## 2021-08-13 MED ORDER — LIDOCAINE HCL (PF) 1 % IJ SOLN: 30.0000 mL | INTRAMUSCULAR | Status: DC | PRN

## 2021-08-13 MED ORDER — LACTATED RINGERS IV SOLN: INTRAVENOUS | Status: DC

## 2021-08-13 MED ORDER — KETOROLAC TROMETHAMINE 30 MG/ML IJ SOLN: 30.0000 mg | Freq: Four times a day (QID) | INTRAMUSCULAR | Status: AC | PRN

## 2021-08-13 MED ORDER — STERILE WATER FOR IRRIGATION IR SOLN
Status: DC | PRN
  Administered 2021-08-13: 1

## 2021-08-13 MED ORDER — FENTANYL CITRATE (PF) 100 MCG/2ML IJ SOLN
INTRAMUSCULAR | Status: DC | PRN
  Administered 2021-08-13: 100 ug via INTRAVENOUS

## 2021-08-13 MED ORDER — OXYTOCIN-SODIUM CHLORIDE 30-0.9 UT/500ML-% IV SOLN: 2.5000 [IU]/h | INTRAVENOUS | Status: DC

## 2021-08-13 MED ORDER — SODIUM BICARBONATE 8.4 % IV SOLN
INTRAVENOUS | Status: AC
  Filled 2021-08-13: qty 50

## 2021-08-13 MED ORDER — PENICILLIN G POTASSIUM 5000000 UNITS IJ SOLR
5.0000 10*6.[IU] | Freq: Once | INTRAMUSCULAR | Status: AC
  Administered 2021-08-13: 5 10*6.[IU] via INTRAVENOUS
  Filled 2021-08-13: qty 5

## 2021-08-13 MED ORDER — SODIUM CHLORIDE 0.9 % IR SOLN
Status: DC | PRN
  Administered 2021-08-13: 1000 mL

## 2021-08-13 MED ORDER — MEPERIDINE HCL 25 MG/ML IJ SOLN: 6.2500 mg | INTRAMUSCULAR | Status: DC | PRN

## 2021-08-13 MED ORDER — PHENYLEPHRINE 80 MCG/ML (10ML) SYRINGE FOR IV PUSH (FOR BLOOD PRESSURE SUPPORT): 80.0000 ug | PREFILLED_SYRINGE | INTRAVENOUS | Status: DC | PRN

## 2021-08-13 MED ORDER — DIBUCAINE (PERIANAL) 1 % EX OINT: 1.0000 | TOPICAL_OINTMENT | CUTANEOUS | Status: DC | PRN

## 2021-08-13 MED ORDER — FENTANYL-BUPIVACAINE-NACL 0.5-0.125-0.9 MG/250ML-% EP SOLN
12.0000 mL/h | EPIDURAL | Status: DC | PRN
  Administered 2021-08-13: 12 mL/h via EPIDURAL
  Filled 2021-08-13: qty 250

## 2021-08-13 MED ORDER — SODIUM CHLORIDE 0.9% FLUSH: 3.0000 mL | INTRAVENOUS | Status: DC | PRN

## 2021-08-13 MED ORDER — OXYTOCIN-SODIUM CHLORIDE 30-0.9 UT/500ML-% IV SOLN
2.5000 [IU]/h | INTRAVENOUS | Status: AC
  Administered 2021-08-13: 2.5 [IU]/h via INTRAVENOUS
  Filled 2021-08-13: qty 500

## 2021-08-13 MED ORDER — TERBUTALINE SULFATE 1 MG/ML IJ SOLN: 0.2500 mg | Freq: Once | INTRAMUSCULAR | Status: DC | PRN

## 2021-08-13 MED ORDER — IBUPROFEN 600 MG PO TABS
600.0000 mg | ORAL_TABLET | Freq: Four times a day (QID) | ORAL | Status: DC
  Administered 2021-08-14 – 2021-08-16 (×9): 600 mg via ORAL
  Filled 2021-08-13 (×9): qty 1

## 2021-08-13 MED ORDER — OXYTOCIN-SODIUM CHLORIDE 30-0.9 UT/500ML-% IV SOLN
1.0000 m[IU]/min | INTRAVENOUS | Status: DC
  Administered 2021-08-13: 200 m[IU]/min via INTRAVENOUS
  Administered 2021-08-13: 2 m[IU]/min via INTRAVENOUS
  Filled 2021-08-13: qty 500

## 2021-08-13 MED ORDER — PENICILLIN G POT IN DEXTROSE 60000 UNIT/ML IV SOLN
3.0000 10*6.[IU] | INTRAVENOUS | Status: DC
  Administered 2021-08-13 (×3): 3 10*6.[IU] via INTRAVENOUS
  Filled 2021-08-13 (×3): qty 50

## 2021-08-13 MED ORDER — WITCH HAZEL-GLYCERIN EX PADS: 1.0000 | MEDICATED_PAD | CUTANEOUS | Status: DC | PRN

## 2021-08-13 MED ORDER — OXYTOCIN BOLUS FROM INFUSION: 333.0000 mL | Freq: Once | INTRAVENOUS | Status: DC

## 2021-08-13 MED ORDER — MORPHINE SULFATE (PF) 0.5 MG/ML IJ SOLN
INTRAMUSCULAR | Status: AC
  Filled 2021-08-13: qty 10

## 2021-08-13 MED ORDER — KETOROLAC TROMETHAMINE 30 MG/ML IJ SOLN: 30.0000 mg | Freq: Once | INTRAMUSCULAR | Status: DC | PRN

## 2021-08-13 SURGICAL SUPPLY — 34 items
APL SKNCLS STERI-STRIP NONHPOA (GAUZE/BANDAGES/DRESSINGS) ×1
BARRIER ADHS 3X4 INTERCEED (GAUZE/BANDAGES/DRESSINGS) IMPLANT
BENZOIN TINCTURE PRP APPL 2/3 (GAUZE/BANDAGES/DRESSINGS) ×1 IMPLANT
BRR ADH 4X3 ABS CNTRL BYND (GAUZE/BANDAGES/DRESSINGS)
CHLORAPREP W/TINT 26ML (MISCELLANEOUS) ×4 IMPLANT
CLAMP CORD UMBIL (MISCELLANEOUS) ×2 IMPLANT
CLOSURE STERI STRIP 1/2 X4 (GAUZE/BANDAGES/DRESSINGS) ×1 IMPLANT
CLOTH BEACON ORANGE TIMEOUT ST (SAFETY) ×2 IMPLANT
DRSG OPSITE POSTOP 4X10 (GAUZE/BANDAGES/DRESSINGS) ×2 IMPLANT
ELECT REM PT RETURN 9FT ADLT (ELECTROSURGICAL)
ELECTRODE REM PT RTRN 9FT ADLT (ELECTROSURGICAL) ×1 IMPLANT
EXTRACTOR VACUUM M CUP 4 TUBE (SUCTIONS) IMPLANT
GLOVE BIO SURGEON STRL SZ 6.5 (GLOVE) ×2 IMPLANT
GLOVE BIOGEL PI IND STRL 7.0 (GLOVE) ×1 IMPLANT
GLOVE BIOGEL PI INDICATOR 7.0 (GLOVE) ×1
GOWN STRL REUS W/TWL LRG LVL3 (GOWN DISPOSABLE) ×4 IMPLANT
KIT ABG SYR 3ML LUER SLIP (SYRINGE) IMPLANT
NDL HYPO 25X5/8 SAFETYGLIDE (NEEDLE) ×1 IMPLANT
NEEDLE HYPO 22GX1.5 SAFETY (NEEDLE) IMPLANT
NEEDLE HYPO 25X5/8 SAFETYGLIDE (NEEDLE) ×2 IMPLANT
NS IRRIG 1000ML POUR BTL (IV SOLUTION) ×2 IMPLANT
PACK C SECTION WH (CUSTOM PROCEDURE TRAY) ×2 IMPLANT
PAD OB MATERNITY 4.3X12.25 (PERSONAL CARE ITEMS) ×2 IMPLANT
SUT CHROMIC 0 CTX 36 (SUTURE) ×4 IMPLANT
SUT PLAIN 0 NONE (SUTURE) IMPLANT
SUT PLAIN 2 0 XLH (SUTURE) IMPLANT
SUT VIC AB 0 CT1 27 (SUTURE) ×8
SUT VIC AB 0 CT1 27XBRD ANBCTR (SUTURE) ×3 IMPLANT
SUT VIC AB 4-0 KS 27 (SUTURE) IMPLANT
SYR CONTROL 10ML LL (SYRINGE) IMPLANT
TOWEL OR 17X24 6PK STRL BLUE (TOWEL DISPOSABLE) ×2 IMPLANT
TRAY FOLEY W/BAG SLVR 14FR LF (SET/KITS/TRAYS/PACK) ×2 IMPLANT
VACUUM CUP M-STYLE MYSTIC II (SUCTIONS) IMPLANT
WATER STERILE IRR 1000ML POUR (IV SOLUTION) ×2 IMPLANT

## 2021-08-13 NOTE — Anesthesia Preprocedure Evaluation (Signed)
Anesthesia Evaluation  Patient identified by MRN, date of birth, ID band Patient awake    Reviewed: Allergy & Precautions, NPO status , Patient's Chart, lab work & pertinent test results  Airway Mallampati: II  TM Distance: >3 FB Neck ROM: Full    Dental no notable dental hx.    Pulmonary asthma ,    Pulmonary exam normal breath sounds clear to auscultation       Cardiovascular negative cardio ROS Normal cardiovascular exam Rhythm:Regular Rate:Normal     Neuro/Psych negative neurological ROS  negative psych ROS   GI/Hepatic negative GI ROS, Neg liver ROS,   Endo/Other  diabetes, Well Controlled, Gestational  Renal/GU negative Renal ROS  negative genitourinary   Musculoskeletal negative musculoskeletal ROS (+)   Abdominal   Peds  Hematology negative hematology ROS (+)   Anesthesia Other Findings IOL for gDM  Reproductive/Obstetrics (+) Pregnancy                             Anesthesia Physical Anesthesia Plan  ASA: 3  Anesthesia Plan: Epidural   Post-op Pain Management:    Induction:   PONV Risk Score and Plan: Treatment may vary due to age or medical condition  Airway Management Planned: Natural Airway  Additional Equipment:   Intra-op Plan:   Post-operative Plan:   Informed Consent: I have reviewed the patients History and Physical, chart, labs and discussed the procedure including the risks, benefits and alternatives for the proposed anesthesia with the patient or authorized representative who has indicated his/her understanding and acceptance.       Plan Discussed with: Anesthesiologist  Anesthesia Plan Comments: (Patient identified. Risks, benefits, options discussed with patient including but not limited to bleeding, infection, nerve damage, paralysis, failed block, incomplete pain control, headache, blood pressure changes, nausea, vomiting, reactions to medication,  itching, and post partum back pain. Confirmed with bedside nurse the patient's most recent platelet count. Confirmed with the patient that they are not taking any anticoagulation, have any bleeding history or any family history of bleeding disorders. Patient expressed understanding and wishes to proceed. All questions were answered. )        Anesthesia Quick Evaluation

## 2021-08-13 NOTE — H&P (Signed)
Jeanne [name] is a 21 y.o. G 2 P 0 at 5 w 1 day presents for IOL secondary to A1DM and GDM.  Received 1 cytotec last night. OB History     Gravida  2   Para      Term      Preterm      AB      Living         SAB      IAB      Ectopic      Multiple      Live Births             Past Medical History:  Diagnosis Date   Anemia    Asthma    Narcolepsy    Past Surgical History:  Procedure Laterality Date   WISDOM TOOTH EXTRACTION     Family History: family history includes Schizophrenia in her brother. Social History:  reports that she has never smoked. She has never used smokeless tobacco. She reports that she does not drink alcohol and does not use drugs.     Maternal Diabetes: Yes:  Diabetes Type:  Diet controlled Genetic Screening: Normal Maternal Ultrasounds/Referrals: Normal Fetal Ultrasounds or other Referrals:  None Maternal Substance Abuse:  No Significant Maternal Medications:  None Significant Maternal Lab Results:  Group B Strep positive Other Comments:  None  Review of Systems  All other systems reviewed and are negative.  Maternal Medical History:  Prenatal Complications - Diabetes: gestational. Diabetes is managed by diet.     Dilation: 2 Effacement (%): 60 Station: -1 Exam by:: Dr Vincente Poli Blood pressure 114/64, pulse 82, temperature 98.2 F (36.8 C), temperature source Oral, resp. rate 16, height 5\' 4"  (1.626 m), weight 93.3 kg, unknown if currently breastfeeding. Maternal Exam:  Uterine Assessment: Contraction strength is mild.  Contraction frequency is irregular.  Abdomen: Fetal presentation: vertex   Fetal Exam Fetal State Assessment: Category I - tracings are normal.   Physical Exam Vitals and nursing note reviewed. Exam conducted with a chaperone present.  Constitutional:      Appearance: Normal appearance.  HENT:     Head: Normocephalic.  Eyes:     Pupils: Pupils are equal, round, and reactive to light.   Cardiovascular:     Rate and Rhythm: Normal rate and regular rhythm.  Neurological:     Mental Status: She is alert.     Prenatal labs: ABO, Rh: --/--/B NEG (07/07 0027) Antibody: NEG (07/07 0027) Rubella: Nonimmune (11/30 0000) RPR: Nonreactive (11/30 0000)  HBsAg: Negative (11/30 0000)  HIV: Non-reactive (11/30 0000)  GBS: Positive/-- (06/16 0000)   Assessment/Plan: IUP at 39 w 1 day LGA A1DM AROM performed Start Pitocin per protocol Epidural Follow labor curve closely  08-24-2000 08/13/2021, 7:39 AM

## 2021-08-13 NOTE — Anesthesia Procedure Notes (Addendum)
Epidural Patient location during procedure: OB Start time: 08/13/2021 10:05 AM End time: 08/13/2021 10:15 AM  Staffing Anesthesiologist: Elmer Picker, MD Performed: anesthesiologist   Preanesthetic Checklist Completed: patient identified, IV checked, risks and benefits discussed, monitors and equipment checked, pre-op evaluation and timeout performed  Epidural Patient position: sitting Prep: DuraPrep and site prepped and draped Patient monitoring: continuous pulse ox, blood pressure, heart rate and cardiac monitor Approach: midline Location: L3-L4 Injection technique: LOR air  Needle:  Needle type: Tuohy  Needle gauge: 17 G Needle length: 9 cm Needle insertion depth: 7 cm Catheter type: closed end flexible Catheter size: 19 Gauge Catheter at skin depth: 12 cm Test dose: negative  Assessment Sensory level: T8 Events: blood not aspirated, injection not painful, no injection resistance, no paresthesia and negative IV test  Additional Notes Patient identified. Risks/Benefits/Options discussed with patient including but not limited to bleeding, infection, nerve damage, paralysis, failed block, incomplete pain control, headache, blood pressure changes, nausea, vomiting, reactions to medication both or allergic, itching and postpartum back pain. Confirmed with bedside nurse the patient's most recent platelet count. Confirmed with patient that they are not currently taking any anticoagulation, have any bleeding history or any family history of bleeding disorders. Patient expressed understanding and wished to proceed. All questions were answered. Sterile technique was used throughout the entire procedure. Please see nursing notes for vital signs. Test dose was given through epidural catheter and negative prior to continuing to dose epidural or start infusion. Warning signs of high block given to the patient including shortness of breath, tingling/numbness in hands, complete motor block,  or any concerning symptoms with instructions to call for help. Patient was given instructions on fall risk and not to get out of bed. All questions and concerns addressed with instructions to call with any issues or inadequate analgesia.  Reason for block:procedure for pain

## 2021-08-13 NOTE — Progress Notes (Signed)
Patient complete since 3 15 pm Pushing for over an hour Exam c/c/+0 Caput  Head has not moved at all Recommend Primary LTCS Risks reviewed She and her husband will discuss

## 2021-08-13 NOTE — Transfer of Care (Signed)
Immediate Anesthesia Transfer of Care Note  Patient: Jeanne [name]  Procedure(s) Performed: CESAREAN SECTION  Patient Location: PACU  Anesthesia Type:Epidural  Level of Consciousness: awake, alert , oriented and patient cooperative  Airway & Oxygen Therapy: Patient Spontanous Breathing  Post-op Assessment: Report given to RN and Post -op Vital signs reviewed and stable  Post vital signs: Reviewed and stable  Last Vitals:  Vitals Value Taken Time  BP 136/78 08/13/21 1940  Temp 36.8 C 08/13/21 1940  Pulse 102 08/13/21 1944  Resp 24 08/13/21 1944  SpO2 100 % 08/13/21 1944  Vitals shown include unvalidated device data.  Last Pain:  Vitals:   08/13/21 1940  TempSrc: Oral  PainSc:          Complications: No notable events documented.

## 2021-08-13 NOTE — Brief Op Note (Signed)
08/13/2021  7:22 PM  PATIENT:  Jeanne [name]  21 y.o. female  PRE-OPERATIVE DIAGNOSIS:   IUP at 30 w 1 day LGA A1DM Arrest of Descent  POST-OPERATIVE DIAGNOSIS:   above  PROCEDURE:  Procedure(s): CESAREAN SECTION (N/A)  SURGEON:  Surgeon(s) and Role:    * Marcelle Overlie, MD - Primary  PHYSICIAN ASSISTANT:   ASSISTANTS: none   ANESTHESIA:   epidural  EBL:  per anesthesia record  BLOOD ADMINISTERED:none  DRAINS: Urinary Catheter (Foley)   LOCAL MEDICATIONS USED:  NONE  SPECIMEN:  No Specimen  DISPOSITION OF SPECIMEN:  N/A  COUNTS:  YES  TOURNIQUET:  * No tourniquets in log *  DICTATION: .Other Dictation: Dictation Number dictated  PLAN OF CARE: Admit to inpatient   PATIENT DISPOSITION:  PACU - hemodynamically stable.   Delay start of Pharmacological VTE agent (>24hrs) due to surgical blood loss or risk of bleeding: not applicable

## 2021-08-13 NOTE — Lactation Note (Signed)
This note was copied from a baby's chart. Lactation Consultation Note Baby needed to feed before glucose lab drawn. LC woke baby up. LC had hand expressed colostrum in spoon and gave to baby. Baby took colostrum well. Placed baby at mom's breast in laid back football hold d/t mom slightly dizzy and pale. Mom stated she is always pale. Mom stated Lt. Nipple hurt when baby BF on it earlier but not on the Rt. Lt. Nipple red. Baby latched well w/LC using t-cup hold. One baby started suckling well LC let go and baby maintained latch himself. Noted milk transfer. LC giving mom hand pump and shells to wear in am to assist in everting nipples more. Suggest to pre-pump before latching. Newborn feeding habits, STS, I&O, positioning, support, supply and demand reviewed. Mom encouraged to feed baby 8-12 times/24 hours and with feeding cues.   Encouraged to call for assistance or questions.   Plan: Wear shells in bra in am. Pre-pump w/hand pump before latching. Use finger stimulation if needed to evert nipple more before latching. Call for assistance  as needed.  Patient Name: Jeanne [name] OMBTD'H Date: 08/13/2021 Reason for consult: Initial assessment;Primapara;Term;Maternal endocrine disorder Age:7 hours  Maternal Data Has patient been taught Hand Expression?: Yes Does the patient have breastfeeding experience prior to this delivery?: No  Feeding    LATCH Score Latch: Repeated attempts needed to sustain latch, nipple held in mouth throughout feeding, stimulation needed to elicit sucking reflex.  Audible Swallowing: A few with stimulation  Type of Nipple: Everted at rest and after stimulation (short shaft/compressible)  Comfort (Breast/Nipple): Filling, red/small blisters or bruises, mild/mod discomfort (Lt. nipple red)  Hold (Positioning): Assistance needed to correctly position infant at breast and maintain latch.  LATCH Score: 6   Lactation Tools Discussed/Used     Interventions Interventions: Breast feeding basics reviewed;Assisted with latch;Skin to skin;Breast massage;Hand express;Breast compression;Adjust position;Support pillows;Position options;Expressed milk;LC Services brochure;Hand pump;Shells  Discharge    Consult Status Consult Status: Follow-up Date: 08/14/21 Follow-up type: In-patient    Charyl Dancer 08/13/2021, 11:21 PM

## 2021-08-13 NOTE — Op Note (Signed)
Jeanne, [name] MEDICAL RECORD NO: [id number] ACCOUNT NO: [id number] DATE OF BIRTH: Nov 21, 2000 FACILITY: [facility] LOCATION: [facility] PHYSICIAN: Letitia Sabala L. Vincente Poli, MD  Operative Report   DATE OF PROCEDURE: 08/13/2021  PREOPERATIVE DIAGNOSES:  Intrauterine pregnancy at 39 weeks and 1 day, induction of labor, arrest of descent, A1 diabetes and large for gestational age.  POSTOPERATIVE DIAGNOSES:  Intrauterine pregnancy at 39 weeks and 1 day, induction of labor, arrest of descent, A1 diabetes and large for gestational age.  PROCEDURE:  Primary low transverse cesarean section.  SURGEON:  Darrol Brandenburg L. Vincente Poli, MD.  ANESTHESIA:  Per anesthesia record, epidural.  ESTIMATED BLOOD LOSS:  Per anesthesia record.  COMPLICATIONS:  None.  PATHOLOGY:  None.  DESCRIPTION OF PROCEDURE:  The patient was taken to the operating room after she was consented about the risk of the procedure.  Her epidural was dosed.  It was found to be adequate.  A Foley catheter had been inserted while on labor and delivery.   Timeout was performed.  She was prepped and draped.  Allis test was performed.  A low transverse incision was made, carried down to the fascia.  Fascia was scored in the midline and extended laterally.  The rectus muscles were separated in the midline.   The peritoneum was entered bluntly.  The peritoneal incision was then extended.  Bladder blade was inserted and the bladder flap was created sharply and then digitally.  The bladder blade was then readjusted.  A low transverse incision was made in the  uterus.  Uterus was entered using a hemostat.  The baby was in occiput transverse position and was rotated and delivered easily, was a female infant and Apgars 9 at one minute and 9 at five minutes and weighed 9 pounds and 13 ounces The baby was handed off to the neonatal team.  The  cord was clamped and cut prior to that. Cord blood was obtained.  The placenta was manually removed and noted to be normal  intact with a 3-vessel cord.  Uterus was exteriorized and appeared unremarkable.  Uterine incision was closed after the uterus was  cleared of all clots and debris. It was closed in 2 layers using 0 chromic in a running locked stitch.  The uterus was returned to the abdomen.  Irrigation was performed.  The incision was carefully inspected and noted to be hemostatic.  The peritoneum  was closed using 0 Vicryl in a running stitch.  The fascia was closed using 0 Vicryl running stitch.  The subcutaneous was closed with plain gut suture interrupted.  The skin was closed with 3-0 Vicryl on a Keith needle.  Benzoin, Steri-Strips and  honeycomb dressing were applied.  All sponge, lap and instrument counts were correct the patient went to recovery room in stable condition.   PAA D: 08/13/2021 7:30:42 pm T: 08/13/2021 10:05:00 pm  JOB: [id number]/ [id number]

## 2021-08-13 NOTE — Anesthesia Postprocedure Evaluation (Signed)
Anesthesia Post Note  Patient: Jeanne [name]  Procedure(s) Performed: CESAREAN SECTION     Patient location during evaluation: PACU Anesthesia Type: Epidural Level of consciousness: oriented and awake and alert Pain management: pain level controlled Vital Signs Assessment: post-procedure vital signs reviewed and stable Respiratory status: spontaneous breathing and respiratory function stable Cardiovascular status: blood pressure returned to baseline and stable Postop Assessment: no headache, no backache, no apparent nausea or vomiting and epidural receding Anesthetic complications: no   No notable events documented.  Last Vitals:  Vitals:   08/13/21 1945 08/13/21 2000  BP: 132/74 119/68  Pulse: 100 90  Resp: (!) 30 19  Temp:    SpO2: 100% 100%    Last Pain:  Vitals:   08/13/21 1945  TempSrc:   PainSc: 0-No pain   Pain Goal:                Epidural/Spinal Function Cutaneous sensation: Tingles (08/13/21 2000), Patient able to flex knees: No (08/13/21 2000), Patient able to lift hips off bed: No (08/13/21 2000), Back pain beyond tenderness at insertion site: No (08/13/21 2000), Progressively worsening motor and/or sensory loss: No (08/13/21 2000), Bowel and/or bladder incontinence post epidural: No (08/13/21 2000)  Tennis Must [place]

## 2021-08-14 ENCOUNTER — Encounter (HOSPITAL_COMMUNITY): Payer: Self-pay | Admitting: Obstetrics and Gynecology

## 2021-08-14 LAB — CBC
HCT: 29.8 % — ABNORMAL LOW (ref 36.0–46.0)
Hemoglobin: 9.5 g/dL — ABNORMAL LOW (ref 12.0–15.0)
MCH: 26.1 pg (ref 26.0–34.0)
MCHC: 31.9 g/dL (ref 30.0–36.0)
MCV: 81.9 fL (ref 80.0–100.0)
Platelets: 244 10*3/uL (ref 150–400)
RBC: 3.64 MIL/uL — ABNORMAL LOW (ref 3.87–5.11)
RDW: 18.1 % — ABNORMAL HIGH (ref 11.5–15.5)
WBC: 11.1 10*3/uL — ABNORMAL HIGH (ref 4.0–10.5)
nRBC: 0 % (ref 0.0–0.2)

## 2021-08-14 NOTE — Lactation Note (Signed)
This note was copied from a baby's chart. Lactation Consultation Note  Patient Name: Jeanne [name] TIWPY'K Date: 08/14/2021 Reason for consult: Follow-up assessment;Mother's request;Difficult latch;Term (weight loss -1% weight loss) Age:22 hours P1, term female infant, -1% weight loss. Mom is breast and bottle feeding due difficult latch. LC ask mom express small amount colostrum out prior to latch, mom latched infant on her right breast using the football hold position, infant after few attempts sustained latch and BF for 15 minutes, mom switched to her left breast and infant BF for another 15 minutes total feeding at breast was 30 minutes. Afterwards mom bottle feed infant 15 mls of formula. Mom will continue to BF infant according to feeding cues, 8 to 12+ times within 24 hours, skin to skin. Mom will continue to ask for latch assistance if needed.  Maternal Data    Feeding Mother's Current Feeding Choice: Breast Milk and Formula Nipple Type: Slow - flow  LATCH Score Latch: Grasps breast easily, tongue down, lips flanged, rhythmical sucking.  Audible Swallowing: Spontaneous and intermittent  Type of Nipple: Everted at rest and after stimulation (Mom has areola edema was given breast shells)  Comfort (Breast/Nipple): Soft / non-tender  Hold (Positioning): Assistance needed to correctly position infant at breast and maintain latch.  LATCH Score: 9   Lactation Tools Discussed/Used Tools: Shells  Interventions Interventions: Assisted with latch;Skin to skin;Adjust position;Support pillows;Position options;Shells;Education  Discharge    Consult Status Consult Status: Follow-up Date: 08/15/21 Follow-up type: In-patient    Danelle Earthly 08/14/2021, 8:49 PM

## 2021-08-14 NOTE — Progress Notes (Signed)
POD # 1  Doing well No complaints BP 124/68 (BP Location: Right Arm)   Pulse 84   Temp 98.9 F (37.2 C) (Oral)   Resp 20   Ht 5\' 4"  (1.626 m)   Wt 93.3 kg   SpO2 98%   Breastfeeding Unknown   BMI 35.29 kg/m  Results for orders placed or performed during the hospital encounter of 08/13/21 (from the past 24 hour(s))  Glucose, capillary     Status: None   Collection Time: 08/13/21  8:06 PM  Result Value Ref Range   Glucose-Capillary 89 70 - 99 mg/dL   Comment 1 Notify RN   CBC     Status: Abnormal   Collection Time: 08/14/21  4:46 AM  Result Value Ref Range   WBC 11.1 (H) 4.0 - 10.5 K/uL   RBC 3.64 (L) 3.87 - 5.11 MIL/uL   Hemoglobin 9.5 (L) 12.0 - 15.0 g/dL   HCT 10/15/21 (L) 10.2 - 58.5 %   MCV 81.9 80.0 - 100.0 fL   MCH 26.1 26.0 - 34.0 pg   MCHC 31.9 30.0 - 36.0 g/dL   RDW 27.7 (H) 82.4 - 23.5 %   Platelets 244 150 - 400 K/uL   nRBC 0.0 0.0 - 0.2 %   Abdomen is soft and non tender Bandage clean and dry and intact  Pod # 1  Doing well Circ today or tomorrow Discharge home tomorrow

## 2021-08-14 NOTE — Lactation Note (Signed)
This note was copied from a baby's chart. Lactation Consultation Note  Patient Name: Jeanne [name] LKHVF'M Date: 08/14/2021   Age:22 hours Mom had family visiting she will call LC for next latch assistance, infant was recently given 20 mls of formula prior to [facility] entering the room. [facility] written her name on the [employer].  Maternal Data    Feeding Nipple Type: Slow - flow  LATCH Score Lactation Tools Discussed/Used    Interventions    Discharge    Consult Status      Danelle Earthly 08/14/2021, 6:17 PM

## 2021-08-14 NOTE — Progress Notes (Signed)
CSW received consult for hx of panic attacks.  CSW met with MOB to offer support and complete assessment, MOB accompanied by FOB and female guest. MOB granted CSW verbal permission to speak in front of FOB and female guest about anything.CSW introduced self and explained reason for consult, MOB was welcoming, polite, and remained engaged during assessment.  CSW and MOB discussed MOB's mental health history. MOB reported that she was diagnosed with anxiety/panic attacks in 2016 or 2017.  MOB denied any current symptoms and was unable to recall the last time she experienced symptoms. MOB shared that she has not had a panic attack in a couple years.   MOB reported that she is not taking any medication nor participating in therapy to treat anxiety. MOB denied needing any mental health resources. MOB denied any additional mental health history. CSW inquired about how MOB was feeling emotionally since giving birth, MOB reported that she was feeling happy. MOB presented calm and did not demonstrate any acute mental health signs/symptoms. CSW assessed for safety, MOB denied SI and HI. CSW did not assess for domestic violence as FOB was present. CSW inquired about MOB's support system, MOB reported that Husband/FOB and FOB's family are supports.   CSW provided education regarding the baby blues period vs. perinatal mood disorders, discussed treatment and gave resources for mental health follow up if concerns arise.  CSW recommends self-evaluation during the postpartum time period using the New Mom Checklist from Postpartum Progress and encouraged MOB to contact a medical professional if symptoms are noted at any time.    CSW provided review of Sudden Infant Death Syndrome (SIDS) precautions. MOB verbalized understanding and reported having all needed items to care for infant including a car seat, crib, pack and  play, and basinet. CSW asked if parents were interested in community supports parenting education programs,  parents agreed. CSW informed parents about the Healthy Start program, parents agreeable to referral.   CSW will complete Healthy Start referral.   CSW identifies no further need for intervention and no barriers to discharge at this time.  Abundio Miu, [place] Worker [facility] Cell#: [phone]

## 2021-08-15 NOTE — Lactation Note (Signed)
This note was copied from a baby's chart. Lactation Consultation Note  Patient Name: Jeanne [name] OPFYT'W Date: 08/15/2021 Reason for consult: Follow-up assessment;Mother's request;Difficult latch;Term;Infant weight loss;Breastfeeding assistance Age:22 hours LC went to assist with breastfeeding with the help of NS. Parents stated infant recent feeding of 42 mls of ( breast, then formula). Infant resting comfortably.  LC provided 20 NS and instructed parents to call for latch assistance with next feeding.   LC encouraged Mom to pump after each feeding for 15 mins with DEBP.  Mom started washing parts and will get on a pumping regimen once they are dry.  All questions answered at the end of the visit.  Maternal Data    Feeding Mother's Current Feeding Choice: Breast Milk and Formula  LATCH Score                    Lactation Tools Discussed/Used Tools: Pump;Flanges Flange Size: 24 Breast pump type: Double-Electric Breast Pump Pump Education: Setup, frequency, and cleaning;Milk Storage Reason for Pumping: increase stimulation Pumping frequency: post pump after feeding for 15 mins  Interventions Interventions: Breast feeding basics reviewed;Expressed milk;DEBP;Education;Pace feeding;LC Psychologist, educational;Infant Driven Feeding Algorithm education  Discharge Pump: DEBP  Consult Status Consult Status: Follow-up Date: 08/16/21 Follow-up type: In-patient    Jeanne Limbach  [name] 08/15/2021, 10:21 PM

## 2021-08-15 NOTE — Progress Notes (Signed)
POD # 2 doing well. BP 128/79 (BP Location: Right Arm)   Pulse 73   Temp 97.7 F (36.5 C) (Oral)   Resp 16   Ht 5\' 4"  (1.626 m)   Wt 93.3 kg   SpO2 98%   Breastfeeding Unknown   BMI 35.29 kg/m  No results found for this or any previous visit (from the past 24 hour(s)). Bandage clean and dry  Uterus non tender POD # 2  Doing well Discharge home tomorrow Circ today - parents consented

## 2021-08-15 NOTE — Lactation Note (Signed)
This note was copied from a baby's chart. Lactation Consultation Note  Patient Name: Jeanne [name] Date: 08/15/2021 Reason for consult: Follow-up assessment;Mother's request;Primapara;1st time breastfeeding Age:22 hours   P1 mother whose infant is now 80 hours old.  This is a term baby at 39+1 weeks.  Mother's current feeding preference is breast/formula.  Mother requested latch assistance.  Baby "Aiden" had a circumcision this morning and was asleep STS on mother's chest when I arrived.  She was interested in trying to latch.  Attempted to awaken baby by providing colostrum drops on my finger.  Multiple drops given, however, he remained too sleepy.  Placed him back STS.  Reassurance given and suggested mother call her RN/LC for latch assistance at the next feeding.  Reviewed breast feeding basics with parents.  They will benefit from further education and support with the next feeding.  RN updated.   Maternal Data Has patient been taught Hand Expression?: Yes Does the patient have breastfeeding experience prior to this delivery?: No  Feeding Mother's Current Feeding Choice: Breast Milk and Formula Nipple Type: Slow - flow  LATCH Score Latch: Too sleepy or reluctant, no latch achieved, no sucking elicited.  Audible Swallowing: None  Type of Nipple: Everted at rest and after stimulation (Short shafted)  Comfort (Breast/Nipple): Soft / non-tender  Hold (Positioning): Assistance needed to correctly position infant at breast and maintain latch.  LATCH Score: 5   Lactation Tools Discussed/Used    Interventions Interventions: Breast feeding basics reviewed;Assisted with latch;Skin to skin;Breast massage;Hand express;Breast compression;Expressed milk;Position options;Support pillows;Adjust position;Education  Discharge Pump: Personal  Consult Status Consult Status: Follow-up Date: 08/16/21 Follow-up type: In-patient    Auburn Hester R Izza Bickle 08/15/2021, 12:06  PM

## 2021-08-16 MED ORDER — IBUPROFEN 600 MG PO TABS: 600.0000 mg | ORAL_TABLET | Freq: Four times a day (QID) | ORAL | 0 refills | Status: DC

## 2021-08-16 MED ORDER — MEASLES, MUMPS & RUBELLA VAC IJ SOLR
0.5000 mL | Freq: Once | INTRAMUSCULAR | Status: DC
  Filled 2021-08-16: qty 0.5

## 2021-08-16 MED ORDER — OXYCODONE HCL 5 MG PO TABS: 5.0000 mg | ORAL_TABLET | Freq: Four times a day (QID) | ORAL | 0 refills | Status: DC | PRN

## 2021-08-16 NOTE — Lactation Note (Signed)
This note was copied from a baby's chart. Lactation Consultation Note  Patient Name: [name] Date: 08/16/2021 Reason for consult: Follow-up assessment Age:22 hours  P1, 7.79%.  Family is breastfeeding, pumping and supplementing with formula. Attempted to latch but baby was sleepy. Discussed continuing to offer breast first then supplement. Mother is pumping 9-10 ml. Reviewed engorgement care and monitoring voids/stools. Feed on demand with cues.  Goal 8-12+ times per day after first 24 hrs.  Place baby STS if not cueing.   Feeding Mother's Current Feeding Choice: Breast Milk and Formula   Interventions Interventions: Breast feeding basics reviewed;Assisted with latch  Discharge Discharge Education: Engorgement and breast care;Warning signs for feeding baby Pump: DEBP;Personal (DEBP)  Consult Status Consult Status: Complete Date: 08/16/21    Dahlia Byes [facility] 08/16/2021, 11:08 AM

## 2021-08-16 NOTE — Progress Notes (Signed)
Patient is going to check with her neurologist to make sure MMR is ok to get. Her neurologist previously told her to avoid certain vaccines and patient is unsure if MMR is one. Will get later if cleared.

## 2021-08-16 NOTE — Discharge Summary (Signed)
Postpartum Discharge Summary       Patient Name: Jeanne [name] DOB: [birth year] MRN: [id number]  Date of admission: 08/13/2021 Delivery date:08/13/2021  Delivering provider: Dian Queen  Date of discharge: 08/16/2021  Admitting diagnosis: Large for gestational age fetus affecting management of mother [O36.60X0] S/P cesarean section [Z98.891] Intrauterine pregnancy: [redacted]w[redacted]d    Secondary diagnosis:  Principal Problem:   Large for gestational age fetus affecting management of mother Active Problems:   S/P cesarean section  Additional problems:  A1DM    Discharge diagnosis: Term Pregnancy Delivered, GDM A1, and LGA                                               Post partum procedures:   Augmentation: N/A Complications: None  Hospital course: Sceduled C/S   21y.o. yo G2P1001 at 32w1das admitted to the hospital 08/13/2021 for scheduled cesarean section with the following indication:Elective Primary and LGA and A1DM .Delivery details are as follows:  Membrane Rupture Time/Date: 7:32 AM ,08/13/2021   Delivery Method:C-Section, Low Transverse  Details of operation can be found in separate operative note.  Patient had an uncomplicated postpartum course.  She is ambulating, tolerating a regular diet, passing flatus, and urinating well. Patient is discharged home in stable condition on  08/16/21        Newborn Data: Birth date:08/13/2021  Birth time:6:55 PM  Gender:Female  Living status:Living  Apgars:8 ,9  Weight:4430 g     Magnesium Sulfate received: No BMZ received: No Rhophylac:N/A MMR:Yes T-DaP:Given prenatally Flu: N/A Transfusion:No  Physical exam  Vitals:   08/15/21 0553 08/15/21 1300 08/15/21 2200 08/16/21 0500  BP: 128/79 125/61 132/84 137/74  Pulse: 73 84 94 73  Resp:  18 18 20   Temp: 97.7 F (36.5 C) 97.9 F (36.6 C) 98.3 F (36.8 C) 98.1 F (36.7 C)  TempSrc: Oral Oral Oral Oral  SpO2: 98%  98% 98%  Weight:      Height:       General: alert,  cooperative, and no distress Lochia: appropriate Uterine Fundus: firm Incision: Healing well with no significant drainage DVT Evaluation: No evidence of DVT seen on physical exam. Labs: Lab Results  Component Value Date   WBC 11.1 (H) 08/14/2021   HGB 9.5 (L) 08/14/2021   HCT 29.8 (L) 08/14/2021   MCV 81.9 08/14/2021   PLT 244 08/14/2021      Latest Ref Rng & Units 12/18/2020   10:22 AM  CMP  Glucose 70 - 99 mg/dL 170   BUN 6 - 20 mg/dL 8   Creatinine 0.57 - 1.00 mg/dL 0.67   Sodium 134 - 144 mmol/L 136   Potassium 3.5 - 5.2 mmol/L 4.2   Chloride 96 - 106 mmol/L 101   CO2 20 - 29 mmol/L 19   Calcium 8.7 - 10.2 mg/dL 9.1   Total Protein 6.0 - 8.5 g/dL 6.5   Total Bilirubin 0.0 - 1.2 mg/dL <0.2   Alkaline Phos 44 - 121 IU/L 88   AST 0 - 40 IU/L 18   ALT 0 - 32 IU/L 20    Edinburgh Score:    08/14/2021    4:39 AM  Edinburgh Postnatal Depression Scale Screening Tool  I have been able to laugh and see the funny side of things. 0  I have looked forward with enjoyment to things.  0  I have blamed myself unnecessarily when things went wrong. 2  I have been anxious or worried for no good reason. 1  I have felt scared or panicky for no good reason. 0  Things have been getting on top of me. 1  I have been so unhappy that I have had difficulty sleeping. 0  I have felt sad or miserable. 0  I have been so unhappy that I have been crying. 1  The thought of harming myself has occurred to me. 0  Edinburgh Postnatal Depression Scale Total 5      After visit meds:  Allergies as of 08/16/2021       Reactions   Strawberry (diagnostic)         Medication List     TAKE these medications    ibuprofen 600 MG tablet Commonly known as: ADVIL Take 1 tablet (600 mg total) by mouth every 6 (six) hours.   oxyCODONE 5 MG immediate release tablet Commonly known as: Oxy IR/ROXICODONE Take 1 tablet (5 mg total) by mouth every 6 (six) hours as needed for moderate pain.   prenatal  multivitamin Tabs tablet Take 1 tablet by mouth daily at 12 noon.         Discharge home in stable condition Infant Feeding: Breast Infant Disposition:home with mother Discharge instruction: per After Visit Summary and Postpartum booklet. Activity: Advance as tolerated. Pelvic rest for 6 weeks.  Diet: carb modified diet Anticipated Birth Control: Unsure Postpartum Appointment:6 weeks Additional Postpartum F/U:    Future Appointments:No future appointments. Follow up Visit:      08/16/2021 Luz Lex, MD

## 2021-08-17 ENCOUNTER — Encounter (HOSPITAL_COMMUNITY): Payer: Self-pay | Admitting: Obstetrics and Gynecology

## 2021-08-17 ENCOUNTER — Inpatient Hospital Stay (HOSPITAL_COMMUNITY)
Admission: AD | Admit: 2021-08-17 | Discharge: 2021-08-17 | Disposition: A | Discharge status: Home / Self Care | Payer: 59 | Attending: Obstetrics and Gynecology | Admitting: Obstetrics and Gynecology

## 2021-08-17 DIAGNOSIS — O909 Complication of the puerperium, unspecified: Secondary | ICD-10-CM | POA: Insufficient documentation

## 2021-08-17 DIAGNOSIS — O165 Unspecified maternal hypertension, complicating the puerperium: Secondary | ICD-10-CM

## 2021-08-17 DIAGNOSIS — Z6791 Unspecified blood type, Rh negative: Secondary | ICD-10-CM | POA: Insufficient documentation

## 2021-08-17 DIAGNOSIS — R109 Unspecified abdominal pain: Secondary | ICD-10-CM | POA: Diagnosis not present

## 2021-08-17 DIAGNOSIS — Z862 Personal history of diseases of the blood and blood-forming organs and certain disorders involving the immune mechanism: Secondary | ICD-10-CM | POA: Insufficient documentation

## 2021-08-17 LAB — CBC
HCT: 29.1 % — ABNORMAL LOW (ref 36.0–46.0)
Hemoglobin: 9.2 g/dL — ABNORMAL LOW (ref 12.0–15.0)
MCH: 25.8 pg — ABNORMAL LOW (ref 26.0–34.0)
MCHC: 31.6 g/dL (ref 30.0–36.0)
MCV: 81.7 fL (ref 80.0–100.0)
Platelets: 353 10*3/uL (ref 150–400)
RBC: 3.56 MIL/uL — ABNORMAL LOW (ref 3.87–5.11)
RDW: 18.4 % — ABNORMAL HIGH (ref 11.5–15.5)
WBC: 12.5 10*3/uL — ABNORMAL HIGH (ref 4.0–10.5)
nRBC: 0 % (ref 0.0–0.2)

## 2021-08-17 LAB — COMPREHENSIVE METABOLIC PANEL
ALT: 47 U/L — ABNORMAL HIGH (ref 0–44)
AST: 41 U/L (ref 15–41)
Albumin: 2.4 g/dL — ABNORMAL LOW (ref 3.5–5.0)
Alkaline Phosphatase: 107 U/L (ref 38–126)
Anion gap: 9 (ref 5–15)
BUN: 10 mg/dL (ref 6–20)
CO2: 22 mmol/L (ref 22–32)
Calcium: 8.5 mg/dL — ABNORMAL LOW (ref 8.9–10.3)
Chloride: 108 mmol/L (ref 98–111)
Creatinine, Ser: 0.7 mg/dL (ref 0.44–1.00)
GFR, Estimated: >60 mL/min (ref >60)
Glucose, Bld: 93 mg/dL (ref 70–99)
Potassium: 3.7 mmol/L (ref 3.5–5.1)
Sodium: 139 mmol/L (ref 135–145)
Total Bilirubin: 0.2 mg/dL — ABNORMAL LOW (ref 0.3–1.2)
Total Protein: 5.5 g/dL — ABNORMAL LOW (ref 6.5–8.1)

## 2021-08-17 LAB — URINALYSIS, ROUTINE W REFLEX MICROSCOPIC
Bacteria, UA: NONE SEEN
Bilirubin Urine: NEGATIVE
Glucose, UA: NEGATIVE mg/dL
Ketones, ur: NEGATIVE mg/dL
Leukocytes,Ua: NEGATIVE
Nitrite: NEGATIVE
Protein, ur: NEGATIVE mg/dL
Specific Gravity, Urine: 1.006 (ref 1.005–1.030)
pH: 6 (ref 5.0–8.0)

## 2021-08-17 MED ORDER — NIFEDIPINE ER OSMOTIC RELEASE 30 MG PO TB24
30.0000 mg | ORAL_TABLET | Freq: Every day | ORAL | Status: DC
  Administered 2021-08-17: 30 mg via ORAL
  Filled 2021-08-17: qty 1

## 2021-08-17 MED ORDER — NIFEDIPINE ER 30 MG PO TB24: 30.0000 mg | ORAL_TABLET | Freq: Every day | ORAL | 1 refills | Status: DC

## 2021-08-17 NOTE — MAU Note (Signed)
.  Jeanne [name] is a 21 y.o. PP C/S in MAU reporting: pt woke up 1915 to go the bathroom felt a sharp ABD/pelvic pain, more internal like her bladder. Pt stated she was not able to barely get up and move due to intense pain, but forced herself to to void around 1945. Pt states the pain stopped after she went to the bathroom. Pt's husband called EMS to evaluate her, they checked her BP and it was elevated 170/100. Pt had no previous issues with elevated BP during delivery. Pt had her C/S last Friday, and has been taking it easy.   Onset of complaint: 1915 Pain score: 0/10 Vitals:   08/17/21 2056 08/17/21 2104  BP: (!) 157/81 (!) 157/81  Pulse: (!) 109 95  Resp:  18  Temp:  97.9 F (36.6 C)  SpO2:  99%      Lab orders placed from triage:

## 2021-08-17 NOTE — MAU Provider Note (Cosign Needed Addendum)
History     [id number]  Arrival date and time: 08/17/21 2052    Chief Complaint  Patient presents with   Hypertension   Abdominal Pain     HPI Jeanne [name] is a 21 y.o. at 4 days post partum who presents via EMS for abdominal pain & hypertension. She had a primary c/section on 7/7 due  Reports episode of bladder pain when she woke up this evening. Pain resolved after voiding. Patient realized that she hadn't urinated since 6 am this morning. Denies fever, n/v/d, dysuria, hematuria.  Took her BP & it was elevated. Denies headache, visual disturbance, or epigastric pain. Denies history of hypertension.    OB History     Gravida  2   Para  1   Term  1   Preterm      AB  1   Living  1      SAB  1   IAB      Ectopic      Multiple  0   Live Births  1           Past Medical History:  Diagnosis Date   Anemia    Asthma    Narcolepsy     Past Surgical History:  Procedure Laterality Date   CESAREAN SECTION N/A 08/13/2021   Procedure: CESAREAN SECTION;  Surgeon: Marcelle Overlie, MD;  Location: [facility]S;  Service: Obstetrics;  Laterality: N/A;   WISDOM TOOTH EXTRACTION      Family History  Problem Relation Age of Onset   Schizophrenia Brother     Allergies  Allergen Reactions   Strawberry (Diagnostic)     No current facility-administered medications on file prior to encounter.   Current Outpatient Medications on File Prior to Encounter  Medication Sig Dispense Refill   ibuprofen (ADVIL) 600 MG tablet Take 1 tablet (600 mg total) by mouth every 6 (six) hours. 30 tablet 0   oxyCODONE (OXY IR/ROXICODONE) 5 MG immediate release tablet Take 1 tablet (5 mg total) by mouth every 6 (six) hours as needed for moderate pain. 10 tablet 0   Prenatal Vit-Fe Fumarate-FA (PRENATAL MULTIVITAMIN) TABS tablet Take 1 tablet by mouth daily at 12 noon.       ROS Pertinent positives and negative per HPI, all others reviewed and negative  Physical Exam   BP (!)  141/81   Pulse 95   Temp 97.9 F (36.6 C) (Oral)   Resp 18   SpO2 100%   Breastfeeding Yes   Patient Vitals for the past 24 hrs:  BP Temp Temp src Pulse Resp SpO2  08/17/21 2215 (!) 141/81 -- -- 95 -- 100 %  08/17/21 2200 136/76 -- -- 98 -- 100 %  08/17/21 2145 136/74 -- -- 95 -- 99 %  08/17/21 2115 133/80 -- -- 93 -- 99 %  08/17/21 2104 (!) 157/81 97.9 F (36.6 C) Oral 95 18 99 %  08/17/21 2100 -- -- -- -- -- 99 %  08/17/21 2056 (!) 157/81 -- -- (!) 109 -- --    Physical Exam Vitals and nursing note reviewed.  Constitutional:      General: She is not in acute distress.    Appearance: She is well-developed. She is not diaphoretic.  Pulmonary:     Effort: Pulmonary effort is normal. No respiratory distress.  Abdominal:     Palpations: Abdomen is soft.     Tenderness: There is no abdominal tenderness.     Comments: Honeycomb dressing in  place, small amount of old blood staining dressing.   Skin:    General: Skin is warm and dry.     Coloration: Skin is pale.  Neurological:     Mental Status: She is alert.     Labs Results for orders placed or performed during the hospital encounter of 08/17/21 (from the past 24 hour(s))  Urinalysis, Routine w reflex microscopic Urine, Clean Catch     Status: Abnormal   Collection Time: 08/17/21  9:34 PM  Result Value Ref Range   Color, Urine STRAW (A) YELLOW   APPearance CLEAR CLEAR   Specific Gravity, Urine 1.006 1.005 - 1.030   pH 6.0 5.0 - 8.0   Glucose, UA NEGATIVE NEGATIVE mg/dL   Hgb urine dipstick MODERATE (A) NEGATIVE   Bilirubin Urine NEGATIVE NEGATIVE   Ketones, ur NEGATIVE NEGATIVE mg/dL   Protein, ur NEGATIVE NEGATIVE mg/dL   Nitrite NEGATIVE NEGATIVE   Leukocytes,Ua NEGATIVE NEGATIVE   RBC / HPF 0-5 0 - 5 RBC/hpf   WBC, UA 0-5 0 - 5 WBC/hpf   Bacteria, UA NONE SEEN NONE SEEN   Squamous Epithelial / LPF 0-5 0 - 5  CBC     Status: Abnormal   Collection Time: 08/17/21  9:43 PM  Result Value Ref Range   WBC 12.5 (H)  4.0 - 10.5 K/uL   RBC 3.56 (L) 3.87 - 5.11 MIL/uL   Hemoglobin 9.2 (L) 12.0 - 15.0 g/dL   HCT 42.6 (L) 83.4 - 19.6 %   MCV 81.7 80.0 - 100.0 fL   MCH 25.8 (L) 26.0 - 34.0 pg   MCHC 31.6 30.0 - 36.0 g/dL   RDW 22.2 (H) 97.9 - 89.2 %   Platelets 353 150 - 400 K/uL   nRBC 0.0 0.0 - 0.2 %  Comprehensive metabolic panel     Status: Abnormal   Collection Time: 08/17/21  9:43 PM  Result Value Ref Range   Sodium 139 135 - 145 mmol/L   Potassium 3.7 3.5 - 5.1 mmol/L   Chloride 108 98 - 111 mmol/L   CO2 22 22 - 32 mmol/L   Glucose, Bld 93 70 - 99 mg/dL   BUN 10 6 - 20 mg/dL   Creatinine, Ser 1.19 0.44 - 1.00 mg/dL   Calcium 8.5 (L) 8.9 - 10.3 mg/dL   Total Protein 5.5 (L) 6.5 - 8.1 g/dL   Albumin 2.4 (L) 3.5 - 5.0 g/dL   AST 41 15 - 41 U/L   ALT 47 (H) 0 - 44 U/L   Alkaline Phosphatase 107 38 - 126 U/L   Total Bilirubin 0.2 (L) 0.3 - 1.2 mg/dL   GFR, Estimated >41 >74 mL/min   Anion gap 9 5 - 15   MAU Course  Procedures Lab Orders         CBC         Comprehensive metabolic panel         Urinalysis, Routine w reflex microscopic Urine, Clean Catch    Meds ordered this encounter  Medications   NIFEdipine (PROCARDIA-XL/NIFEDICAL-XL) 24 hr tablet 30 mg   NIFEdipine (ADALAT CC) 30 MG 24 hr tablet    Sig: Take 1 tablet (30 mg total) by mouth daily.    Dispense:  30 tablet    Refill:  1    Order Specific Question:   Supervising Provider    Answer:   Reva Bores [[id number]]   Imaging Orders  No imaging studies ordered today    MDM Labs  collected Care turned over to [facility] CNM Judeth Horn, NP 08/17/2021 9:56 PM   Results reviewed with Dr. Macon Large- recommends starting Procardia and BP check at the end of the week.   Plan of care reviewed with patient and partner. First dose of Procardia given before discharge.   Dr. Elon Spanner notified of patient being started on antihypertensives and need for BP check- MD instructed patient to call office tomorrow to make  appointment.  Assessment and Plan   1. Postpartum hypertension    -Discharge home in stable condition -Rx for Procardia sent to patient's pharmacy -Preeclampsia precautions discussed -Patient advised to follow-up with OB this week for a BP check -Patient may return to MAU as needed or if her condition were to change or worsen  Rolm Bookbinder, CNM 08/17/21 10:43 PM

## 2021-08-21 ENCOUNTER — Telehealth (HOSPITAL_COMMUNITY): Payer: Self-pay

## 2021-08-21 NOTE — Telephone Encounter (Signed)
Patient reports feeling good. She states that she took her honeycomb dressing off and her incision looks good. Patient declines questions/concerns about her health and healing.  Patient reports that baby is doing well. Eating, peeing/pooping, and gaining weight well. Baby sleeps in a bassinet. RN reviewed ABC's of safe sleep with patient. Patient declines any questions or concerns about baby.  EPDS score is 4.  Marcelino Duster [facility]  08/21/2021,1129

## 2021-08-23 ENCOUNTER — Inpatient Hospital Stay (HOSPITAL_COMMUNITY): Payer: 59

## 2022-07-10 IMAGING — US US OB < 14 WEEKS - US OB TV
1 series · 15 of 28 positions shown · non-contrast
Comparison: None.

CLINICAL DATA: Vaginal bleeding

EXAM:
OBSTETRIC <14 WK US AND TRANSVAGINAL OB US
TECHNIQUE: Both transabdominal and transvaginal ultrasound examinations were
performed for complete evaluation of the gestation as well as the
maternal uterus, adnexal regions, and pelvic cul-de-sac.
Transvaginal technique was performed to assess early pregnancy.

[Series 1: us ob < 14 weeks - us ob tv · 35 acquisitions, 15 frames shown]
[im 1/35]
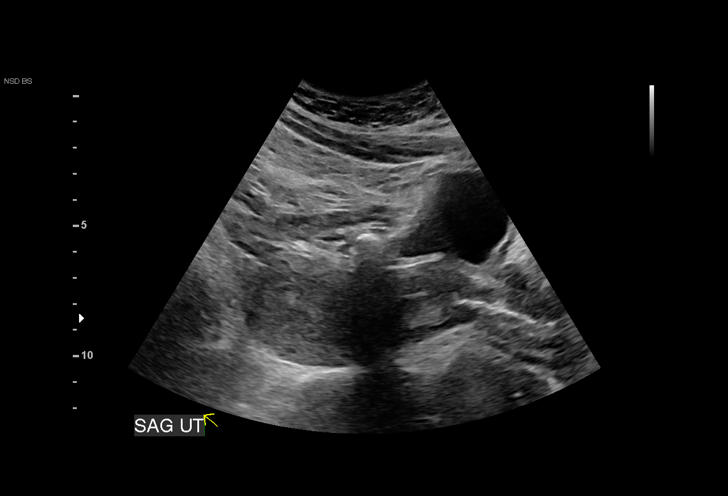
[im 3/35]
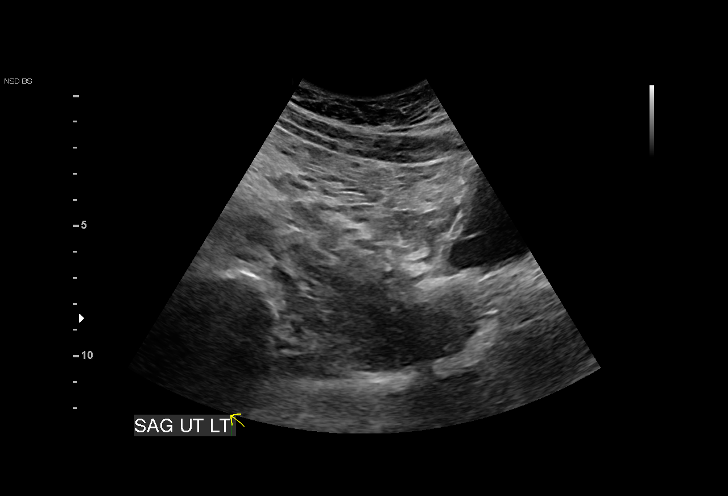
[im 6/35]
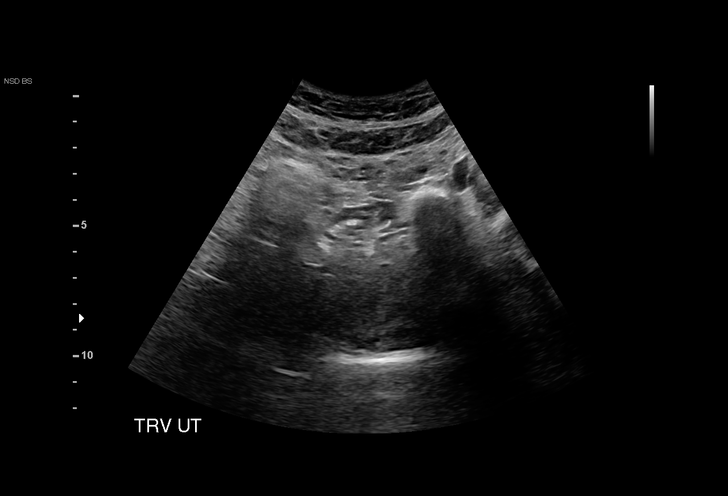
[im 8/35]
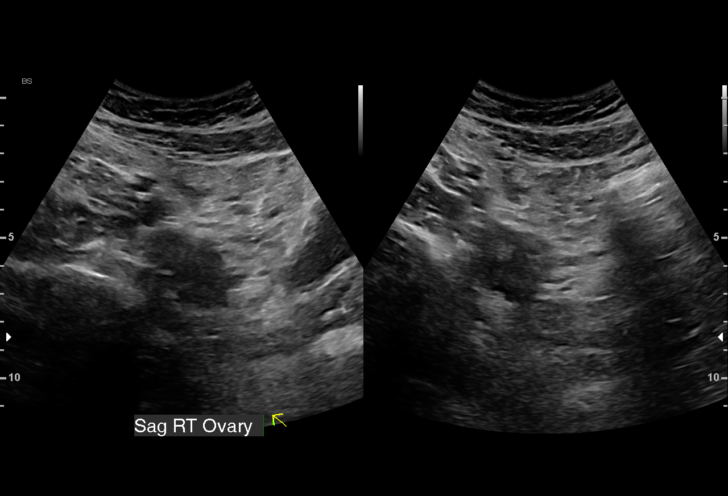
[im 11/35]
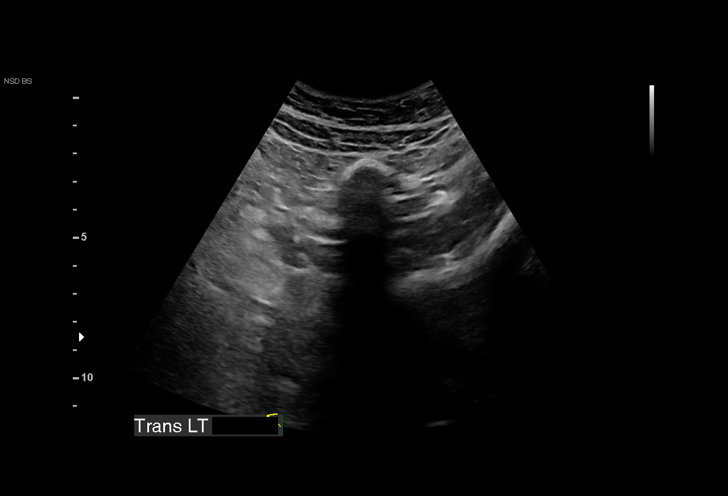
[im 13/35]
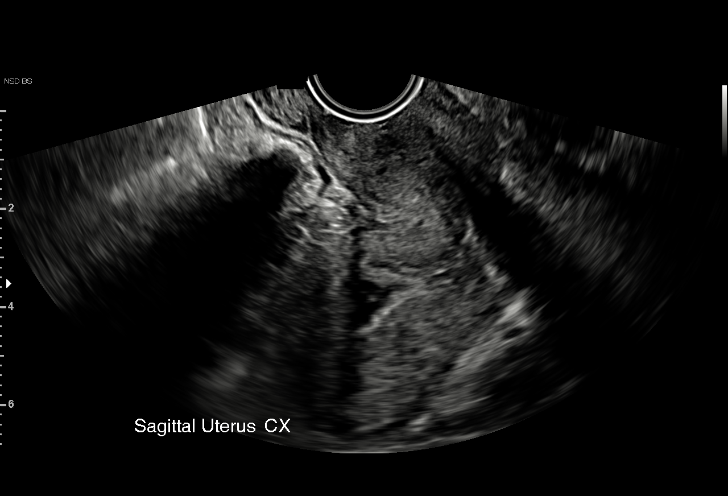
[im 16/35]
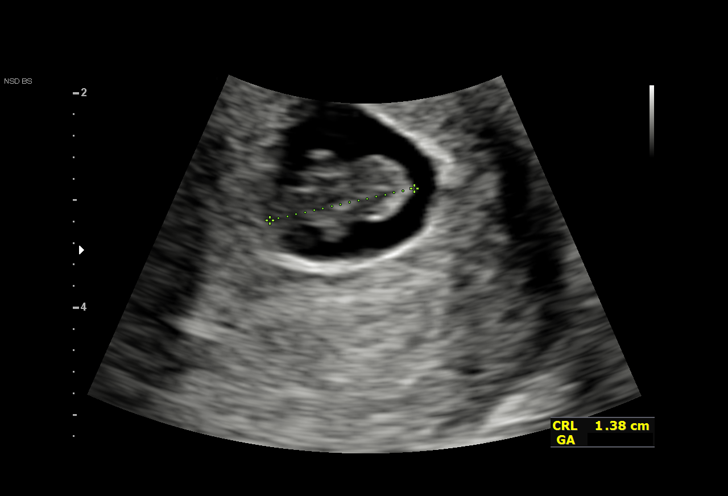
[im 18/35]
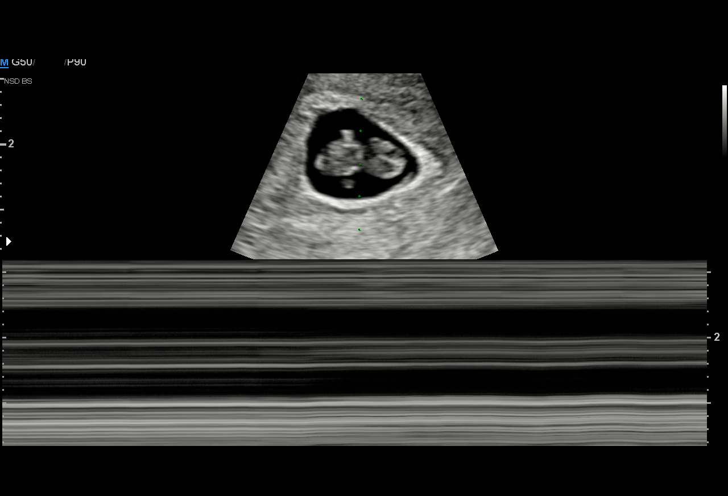
[im 19/35]
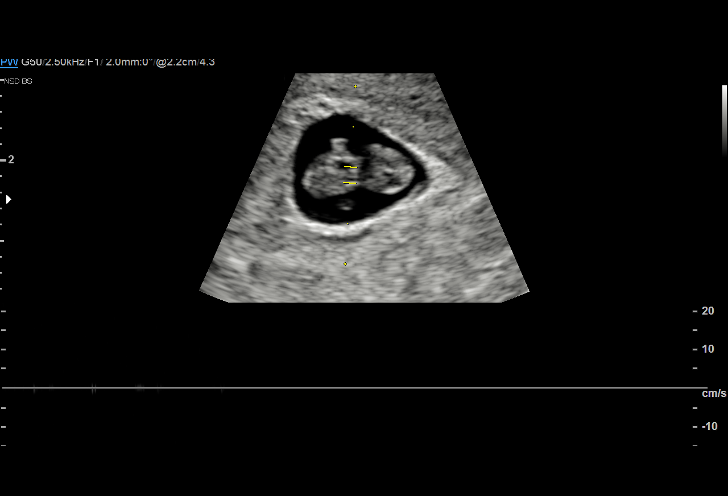
[im 22/35]
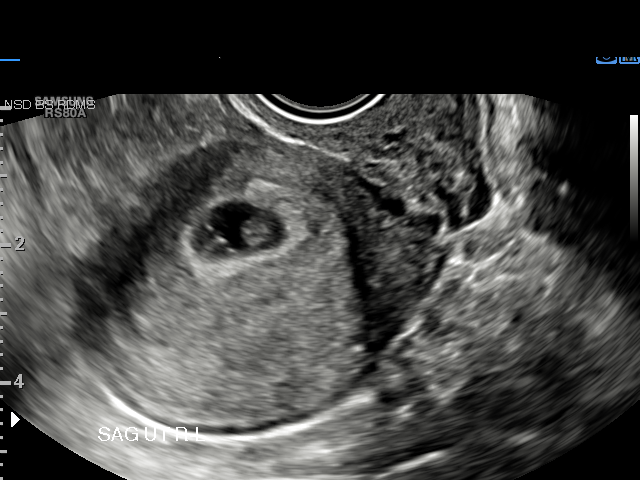
[im 24/35]
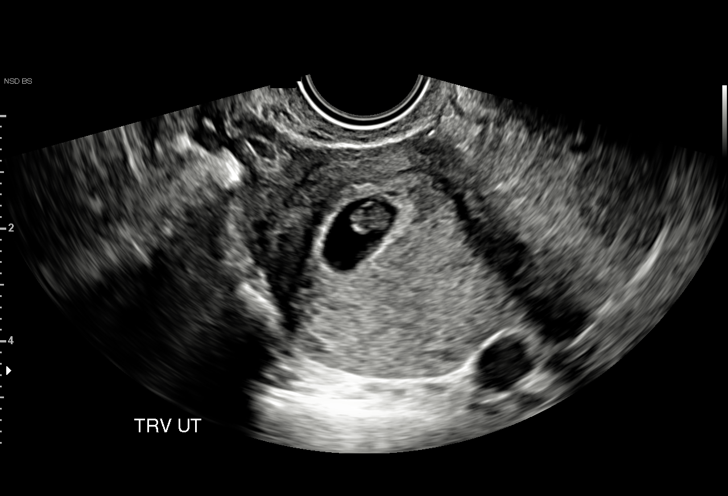
[im 27/35]
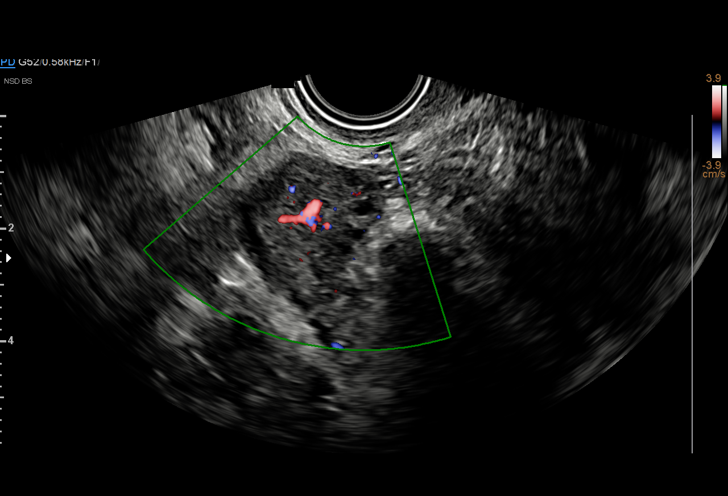
[im 29/35]
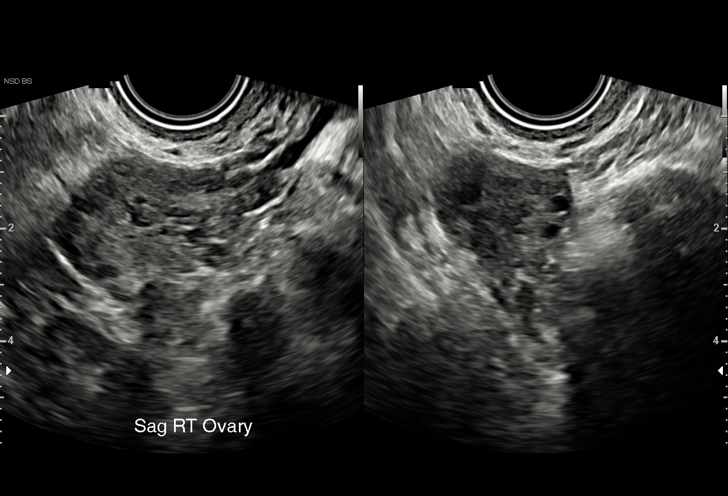
[im 32/35]
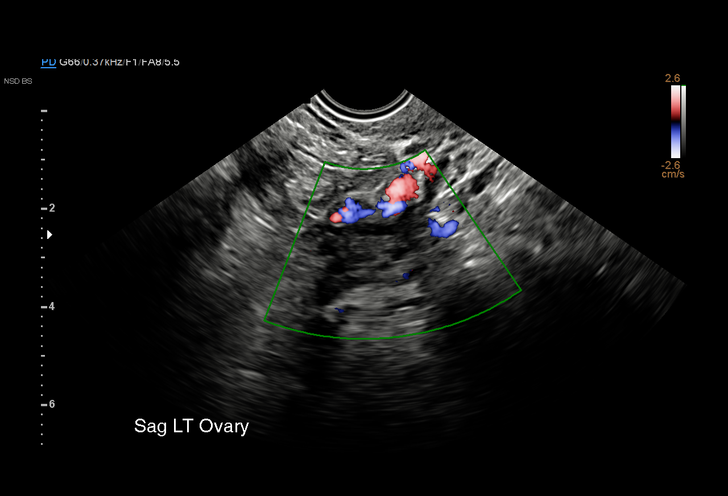
[im 35/35]
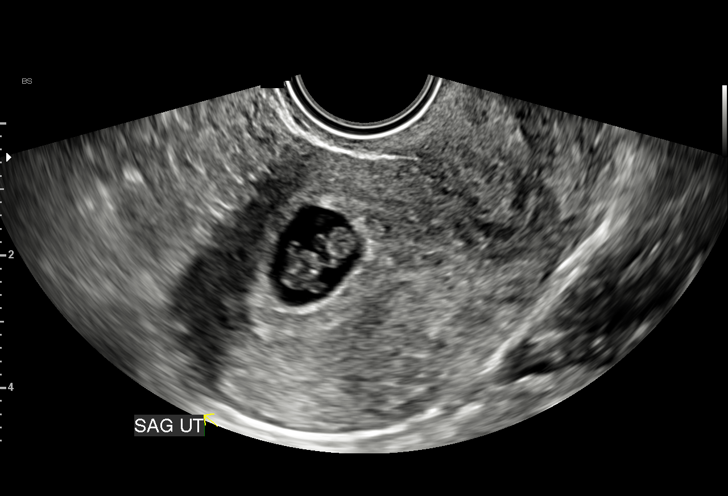

[15 of 28 positions shown; findings below may reference images not displayed]

FINDINGS: Intrauterine gestational sac: Single

Yolk sac:  Not Visualized.

Embryo:  Visualized.

Cardiac Activity: Not Visualized.

CRL:  13.7 mm   7 w   4 d

Subchorionic hemorrhage:  None visualized.

Maternal uterus/adnexae: Unremarkable
IMPRESSION: Single intrauterine pregnancy measuring 7 weeks 4 days (13.7mm CRL).
No cardiac activity. Findings meet criteria for failed pregnancy.
This follows SRU consensus guidelines: Diagnostic Criteria for
Nonviable Pregnancy Early in the First Trimester. N Engl J Med

## 2022-09-19 LAB — OB RESULTS CONSOLE RUBELLA ANTIBODY, IGM: Rubella: NON-IMMUNE/NOT IMMUNE

## 2022-09-19 LAB — OB RESULTS CONSOLE HEPATITIS B SURFACE ANTIGEN: Hepatitis B Surface Ag: NEGATIVE

## 2022-09-19 LAB — OB RESULTS CONSOLE RPR: RPR: NONREACTIVE

## 2022-09-19 LAB — HEPATITIS C ANTIBODY: HCV Ab: NEGATIVE

## 2022-09-19 LAB — OB RESULTS CONSOLE HIV ANTIBODY (ROUTINE TESTING): HIV: NONREACTIVE

## 2022-09-29 LAB — OB RESULTS CONSOLE GC/CHLAMYDIA
Chlamydia: NEGATIVE
Neisseria Gonorrhea: NEGATIVE

## 2022-10-05 ENCOUNTER — Encounter: Payer: Self-pay | Admitting: Family Medicine

## 2022-10-05 ENCOUNTER — Ambulatory Visit: Payer: 59 | Admitting: Family Medicine

## 2022-10-05 VITALS — BP 123/75 | HR 92 | Ht 64.5 in | Wt 214.0 lb

## 2022-10-05 DIAGNOSIS — D649 Anemia, unspecified: Secondary | ICD-10-CM | POA: Diagnosis not present

## 2022-10-05 DIAGNOSIS — G47411 Narcolepsy with cataplexy: Secondary | ICD-10-CM | POA: Diagnosis not present

## 2022-10-05 DIAGNOSIS — J4520 Mild intermittent asthma, uncomplicated: Secondary | ICD-10-CM | POA: Diagnosis not present

## 2022-10-05 DIAGNOSIS — Z0000 Encounter for general adult medical examination without abnormal findings: Secondary | ICD-10-CM

## 2022-10-05 LAB — CBC WITH DIFFERENTIAL/PLATELET
Basophils Absolute: 0.1 10*3/uL (ref 0.0–0.1)
Basophils Relative: 0.5 % (ref 0.0–3.0)
Eosinophils Absolute: 0.1 10*3/uL (ref 0.0–0.7)
Eosinophils Relative: 1.2 % (ref 0.0–5.0)
HCT: 38 % (ref 36.0–46.0)
Hemoglobin: 12 g/dL (ref 12.0–15.0)
Lymphocytes Relative: 26.7 % (ref 12.0–46.0)
Lymphs Abs: 3 10*3/uL (ref 0.7–4.0)
MCHC: 31.5 g/dL (ref 30.0–36.0)
MCV: 81.8 fl (ref 78.0–100.0)
Monocytes Absolute: 0.7 10*3/uL (ref 0.1–1.0)
Monocytes Relative: 6.2 % (ref 3.0–12.0)
Neutro Abs: 7.4 10*3/uL (ref 1.4–7.7)
Neutrophils Relative %: 65.4 % (ref 43.0–77.0)
Platelets: 412 10*3/uL — ABNORMAL HIGH (ref 150.0–400.0)
RBC: 4.65 Mil/uL (ref 3.87–5.11)
RDW: 14.6 % (ref 11.5–15.5)
WBC: 11.3 10*3/uL — ABNORMAL HIGH (ref 4.0–10.5)

## 2022-10-05 LAB — COMPREHENSIVE METABOLIC PANEL
ALT: 17 U/L (ref 0–35)
AST: 15 U/L (ref 0–37)
Albumin: 3.7 g/dL (ref 3.5–5.2)
Alkaline Phosphatase: 80 U/L (ref 39–117)
BUN: 7 mg/dL (ref 6–23)
CO2: 26 mEq/L (ref 19–32)
Calcium: 9.5 mg/dL (ref 8.4–10.5)
Chloride: 103 mEq/L (ref 96–112)
Creatinine, Ser: 0.61 mg/dL (ref 0.40–1.20)
GFR: 126.54 mL/min (ref >60.00)
Glucose, Bld: 89 mg/dL (ref 70–99)
Potassium: 4.2 mEq/L (ref 3.5–5.1)
Sodium: 137 mEq/L (ref 135–145)
Total Bilirubin: 0.3 mg/dL (ref 0.2–1.2)
Total Protein: 6.5 g/dL (ref 6.0–8.3)

## 2022-10-05 LAB — TSH: TSH: 0.83 u[IU]/mL (ref 0.35–5.50)

## 2022-10-05 LAB — LIPID PANEL
Cholesterol: 217 mg/dL — ABNORMAL HIGH (ref 0–200)
HDL: 64.4 mg/dL (ref >39.00)
NonHDL: 152.25
Total CHOL/HDL Ratio: 3
Triglycerides: 280 mg/dL — ABNORMAL HIGH (ref 0.0–149.0)
VLDL: 56 mg/dL — ABNORMAL HIGH (ref 0.0–40.0)

## 2022-10-05 LAB — LDL CHOLESTEROL, DIRECT: Direct LDL: 143 mg/dL

## 2022-10-05 NOTE — Assessment & Plan Note (Signed)
Asymptomatic.  Recently had iron checked at [facility] and was told to restart iron supplement 2 weeks ago.

## 2022-10-05 NOTE — Assessment & Plan Note (Signed)
Asymptomatic.  Well controlled. No concerns.

## 2022-10-05 NOTE — Patient Instructions (Signed)
Thank you for choosing Jeanne [name] at [facility] for your Primary [name] needs. I am excited for the opportunity to partner with you to meet your health [name] goals. It was a pleasure meeting you today!  Information on diet, exercise, and health maintenance recommendations are listed below. This is information to help you be sure you are on track for optimal health and monitoring.   Please look over this and let us know if you have any questions or if you have completed any of the health maintenance outside of [place] so that we can be sure your records are up to date.  ___________________________________________________________  MyChart:  For all urgent or time sensitive needs we ask that you please call the office to avoid delays. Our number is [phone]. MyChart is not constantly monitored and due to the large volume of messages a day, replies may take up to 72 business hours.  MyChart Policy: MyChart allows for you to see your visit notes, after visit summary, provider recommendations, lab and tests results, make an appointment, request refills, and contact your provider or the office for non-urgent questions or concerns. Providers are seeing patients during normal business hours and do not have built in time to review MyChart messages.  We ask that you allow a minimum of 3 business days for responses to MyChart messages. For this reason, please do not send urgent requests through MyChart. Please call the office at [phone]. New and ongoing conditions may require a visit. We have virtual and in-person visits available for your convenience.  Complex MyChart concerns may require a visit. Your provider may request you schedule a virtual or in-person visit to ensure we are providing the best [name] possible. MyChart messages sent after 11:00 AM on Friday will not be received by the provider until Monday morning.    Lab and Test Results: You will receive your lab and test  results on MyChart as soon as they are completed and results have been sent by the lab or testing facility. Due to this service, you will receive your results BEFORE your provider.  I review lab and test results each morning prior to seeing patients. Some results require collaboration with other providers to ensure you are receiving the most appropriate [name]. For this reason, we ask that you please allow a minimum of 3-5 business days from the time that ALL results have been received for your provider to receive and review lab and test results and contact you about these.  Most lab and test result comments from the provider will be sent through MyChart. Your provider may recommend changes to the plan of [name], follow-up visits, repeat testing, ask questions, or request an office visit to discuss these results. You may reply directly to this message or call the office to provide information for the provider or set up an appointment. In some instances, you will be called with test results and recommendations. Please let us know if this is preferred and we will make note of this in your chart to provide this for you.    If you have not heard a response to your lab or test results in 5 business days from all results returning to MyChart, please call the office to let us know. We ask that you please avoid calling prior to this time unless there is an emergent concern. Due to high call volumes, this can delay the resulting process.  After Hours: For all non-emergency after hours needs, please   call the office at [phone] and select the option to reach the on-call  service. On-call services are shared between multiple [facility] offices and therefore it will not be possible to speak directly with your provider. On-call providers may provide medical advice and recommendations, but are unable to provide refills for maintenance medications.  For all emergency or urgent medical needs after normal business hours, we  recommend that you seek [name] at the closest Urgent [name] or Emergency Department to ensure appropriate treatment in a timely manner.  [facility] has a 24 hour emergency room located on the ground floor for your convenience.   Urgent Concerns During the Business Day Providers are seeing patients from 8AM to 5PM with a busy schedule and are most often not able to respond to non-urgent calls until the end of the day or the next business day. If you should have URGENT concerns during the day, please call and speak to the nurse or schedule a same day appointment so that we can address your concern without delay.   Thank you, again, for choosing me as your health [name] partner. I appreciate your trust and look forward to learning more about you!   Taylor B. Beck, DNP, FNP-C  ___________________________________________________________  Health Maintenance Recommendations Screening Testing Mammogram Every 1-2 years based on history and risk factors Starting at age 50 Pap Smear Ages 21-39 every 3 years Ages 30-65 every 5 years with HPV testing More frequent testing may be required based on results and history Colon Cancer Screening Every 1-10 years based on test performed, risk factors, and history Starting at age 45 Bone Density Screening Every 2-10 years based on history Starting at age 65 for women Recommendations for men differ based on medication usage, history, and risk factors AAA Screening One time ultrasound Men 65-75 years old who have ever smoked Lung Cancer Screening Low Dose Lung CT every 12 months Age 50-80 years with a 20 pack-year smoking history who still smoke or who have quit within the last 15 years  Screening Labs Routine  Labs: Complete Blood Count (CBC), Complete Metabolic Panel (CMP), Cholesterol (Lipid Panel) Every 6-12 months based on history and medications May be recommended more frequently based on current conditions or previous results Hemoglobin  A1c Lab Every 3-12 months based on history and previous results Starting at age 45 or earlier with diagnosis of diabetes, high cholesterol, BMI >26, and/or risk factors Frequent monitoring for patients with diabetes to ensure blood sugar control Thyroid Panel  Every 6 months based on history, symptoms, and risk factors May be repeated more often if on medication HIV One time testing for all patients 13 and older May be repeated more frequently for patients with increased risk factors or exposure Hepatitis C One time testing for all patients 18 and older May be repeated more frequently for patients with increased risk factors or exposure Gonorrhea, Chlamydia Every 12 months for all sexually active persons 13-24 years Additional monitoring may be recommended for those who are considered high risk or who have symptoms PSA Men 40-54 years old with risk factors Additional screening may be recommended from age 55-69 based on risk factors, symptoms, and history  Vaccine Recommendations Tetanus Booster All adults every 10 years Flu Vaccine All patients 6 months and older every year COVID Vaccine All patients 12 years and older Initial dosing with booster May recommend additional booster based on age and health history HPV Vaccine 2 doses all patients age 9-26 Dosing may be considered   for patients over 26 Shingles Vaccine (Shingrix) 2 doses all adults 50 years and older Pneumonia (Pneumovax 23) All adults 65 years and older May recommend earlier dosing based on health history Pneumonia (Prevnar 13) All adults 65 years and older Dosed 1 year after Pneumovax 23 Pneumonia (Prevnar 20) All adults 65 years and older (adults 19-64 with certain conditions or risk factors) 1 dose  For those who have not received Prevnar 13 vaccine previously   Additional Screening, Testing, and Vaccinations may be recommended on an individualized basis based on family history, health history, risk  factors, and/or exposure.  __________________________________________________________  Diet Recommendations for All Patients  I recommend that all patients maintain a diet low in saturated fats, carbohydrates, and cholesterol. While this can be challenging at first, it is not impossible and small changes can make big differences.  Things to try: Decreasing the amount of soda, sweet tea, and/or juice to one or less per day and replace with water While water is always the first choice, if you do not like water you may consider adding a water additive without sugar to improve the taste other sugar free drinks Replace potatoes with a brightly colored vegetable  Use healthy oils, such as canola oil or olive oil, instead of butter or hard margarine Limit your bread intake to two pieces or less a day Replace regular pasta with low carb pasta options Bake, broil, or grill foods instead of frying Monitor portion sizes  Eat smaller, more frequent meals throughout the day instead of large meals  An important thing to remember is, if you love foods that are not great for your health, you don't have to give them up completely. Instead, allow these foods to be a reward when you have done well. Allowing yourself to still have special treats every once in a while is a nice way to tell yourself thank you for working hard to keep yourself healthy.   Also remember that every day is a new day. If you have a bad day and "fall off the wagon", you can still climb right back up and keep moving along on your journey!  We have resources available to help you!  Some websites that may be helpful include: www.MyPlate.gov  Www.VeryWellFit.com _____________________________________________________________  Activity Recommendations for All Patients  I recommend that all adults get at least 20 minutes of moderate physical activity that elevates your heart rate at least 5 days out of the week.  Some examples  include: Walking or jogging at a pace that allows you to carry on a conversation Cycling (stationary bike or outdoors) Water aerobics Yoga Weight lifting Dancing If physical limitations prevent you from putting stress on your joints, exercise in a pool or seated in a chair are excellent options.  Do determine your MAXIMUM heart rate for activity: 220 - YOUR AGE = MAX Heart Rate   Remember! Do not push yourself too hard.  Start slowly and build up your pace, speed, weight, time in exercise, etc.  Allow your body to rest between exercise and get good sleep. You will need more water than normal when you are exerting yourself. Do not wait until you are thirsty to drink. Drink with a purpose of getting in at least 8, 8 ounce glasses of water a day plus more depending on how much you exercise and sweat.    If you begin to develop dizziness, chest pain, abdominal pain, jaw pain, shortness of breath, headache, vision changes, lightheadedness, or other concerning symptoms,   stop the activity and allow your body to rest. If your symptoms are severe, seek emergency evaluation immediately. If your symptoms are concerning, but not severe, please let us know so that we can recommend further evaluation.     

## 2022-10-05 NOTE — Assessment & Plan Note (Signed)
No meds, stable. No acute concerns She would like to establish with local neurologist She will find out the info of where she was seen previously in [place] so we can request records

## 2022-10-05 NOTE — Progress Notes (Signed)
Complete physical exam  Patient: Jeanne [name]   DOB: [birth year]   22 y.o. Female  MRN: [id number]  Subjective:    Chief Complaint  Patient presents with   Establish Care    Sarra Croke is a 22 y.o. female who presents today for a complete physical exam. She reports consuming a general diet. The patient does not participate in regular exercise at present. She generally feels well. She reports sleeping well. She does not have additional problems to discuss today.   Currently lives with: husband and 42-month-old sone Acute concerns or interim problems since last visit: none, currently pregnant (~11 weeks, following with [facility])  Vision concerns: no concerns Dental concerns: no concerns STD concerns: no concerns  Patient denies ETOH use. Patient denies nicotine use. Patient denies illegal substance use.     Females:  She is currently  sexually active with husband She denies  concerns today about STI Contraception choices are: none LMP: currently pregnant     Discussed the use of AI scribe software for clinical note transcription with the patient, who gave verbal consent to proceed.  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 She has been primarily following up with an OB-GYN due to a previous pregnancy and a current pregnancy, now at 11 weeks. She has a 82-month-old child from the previous pregnancy.  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 She has not been on any medication for this condition since and report feeling fine. However, she expresses a need for a repeat sleep study, as the last one was conducted when she was 22 years old. She has not seen a specialist for this condition in a while. Cannot  recall name of prior neurologist, but will try to look it up so we can request records.   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 She has had a C-section and wisdom teeth removal in the past. She reports no family history of diabetes, stroke, heart attack, or cancer, but has a brother with schizophrenia.  The patient denies any use of alcohol, nicotine, or drugs. She had regular periods before her pregnancies. She has been taking iron consistently for about two weeks due to slightly low levels detected in her last appointment. She reports no known allergies to any medication.        Most recent fall risk assessment:    10/05/2022    9:17 AM  Fall Risk   Falls in the past year? 0  Number falls in past yr: 0  Injury with Fall? 0  Risk for fall due to : No Fall Risks  Follow up Falls evaluation completed     Most recent depression screenings:    10/05/2022    9:18 AM 06/18/2021   12:18 PM  PHQ 2/9 Scores  PHQ - 2 Score 0 0  PHQ- 9 Score 3             Patient Care Team: Clayborne Dana, NP as PCP - General (Family Medicine)   Outpatient Medications Prior to Visit  Medication Sig   Cyanocobalamin (B-12 PO) Take by mouth daily.   Ferrous Gluconate (IRON 27 PO) Take by mouth daily.   Prenatal  Vit-Fe Fumarate-FA (PRENATAL MULTIVITAMIN) TABS tablet Take 1 tablet by mouth daily at 12 noon.   [DISCONTINUED] ibuprofen (ADVIL) 600 MG tablet Take 1 tablet (600 mg total) by mouth every 6 (six) hours.   [DISCONTINUED] NIFEdipine (ADALAT CC) 30 MG 24 hr tablet Take 1 tablet (30 mg total) by mouth daily.   [DISCONTINUED] oxyCODONE (OXY IR/ROXICODONE) 5 MG immediate release tablet Take 1 tablet (5 mg total) by mouth every 6 (six) hours as needed for moderate pain.   No facility-administered medications prior to visit.    ROS All review of systems negative except what is listed in the  HPI        Objective:     BP 123/75   Pulse 92   Ht 5' 4.5" (1.638 m)   Wt 214 lb (97.1 kg)   SpO2 100%   BMI 36.17 kg/m    Physical Exam Vitals reviewed.  Constitutional:      General: She is not in acute distress.    Appearance: Normal appearance. She is not ill-appearing.  HENT:     Head: Normocephalic and atraumatic.     Right Ear: Tympanic membrane normal.     Left Ear: Tympanic membrane normal.     Nose: Nose normal.     Mouth/Throat:     Mouth: Mucous membranes are moist.     Pharynx: Oropharynx is clear.  Eyes:     Extraocular Movements: Extraocular movements intact.     Conjunctiva/sclera: Conjunctivae normal.     Pupils: Pupils are equal, round, and reactive to light.  Neck:     Vascular: No carotid bruit.  Cardiovascular:     Rate and Rhythm: Normal rate and regular rhythm.     Pulses: Normal pulses.     Heart sounds: Normal heart sounds.  Pulmonary:     Effort: Pulmonary effort is normal.     Breath sounds: Normal breath sounds.  Abdominal:     General: Abdomen is flat. Bowel sounds are normal. There is no distension.     Palpations: Abdomen is soft. There is no mass.     Tenderness: There is no abdominal tenderness. There is no right CVA tenderness, left CVA tenderness, guarding or rebound.  Genitourinary:    Comments: Deferred exam Musculoskeletal:        General: Normal range of motion.     Cervical back: Normal range of motion and neck supple. No tenderness.     Right lower leg: No edema.     Left lower leg: No edema.  Lymphadenopathy:     Cervical: No cervical adenopathy.  Skin:    General: Skin is warm and dry.     Capillary Refill: Capillary refill takes less than 2 seconds.  Neurological:     General: No focal deficit present.     Mental Status: She is alert and oriented to person, place, and time. Mental status is at baseline.  Psychiatric:        Mood and Affect: Mood normal.        Behavior: Behavior normal.        Thought  Content: Thought content normal.        Judgment: Judgment normal.      No results found for any visits on 10/05/22.     Assessment & Plan:    Routine Health Maintenance and Physical Exam Discussed health promotion and safety including diet and exercise recommendations, dental health, and injury prevention. Tobacco cessation if applicable. Seat belts, sunscreen, smoke detectors,  etc.    Immunization History  Administered Date(s) Administered   Tdap 06/01/2021    Health Maintenance  Topic Date Due   COVID-19 Vaccine (1) Never done   HPV VACCINES (1 - 3-dose series) Never done   PAP-Cervical Cytology Screening  Never done   PAP SMEAR-Modifier  Never done   CHLAMYDIA SCREENING  01/19/2022   INFLUENZA VACCINE  09/08/2022   DTaP/Tdap/Td (2 - Td or Tdap) 06/02/2031   Hepatitis C Screening  Completed   HIV Screening  Completed        Problem List Items Addressed This Visit       Active Problems   Narcolepsy    No meds, stable. No acute concerns She would like to establish with local neurologist She will find out the info of where she was seen previously in [place] so we can request records      Relevant Orders   Ambulatory referral to Neurology   Asthma    Asymptomatic.  Well controlled. No concerns.       Anemia    Asymptomatic.  Recently had iron checked at [facility] and was told to restart iron supplement 2 weeks ago.      Relevant Medications   Ferrous Gluconate (IRON 27 PO)   Cyanocobalamin (B-12 PO)   Other Relevant Orders   CBC with Differential/Platelet   Other Visit Diagnoses     Encounter for medical examination to establish care    -  Primary   Annual physical exam       Relevant Orders   CBC with Differential/Platelet   Comprehensive metabolic panel   Lipid panel   TSH      Return in about 1 year (around 10/05/2023) for physical.     Clayborne Dana, NP

## 2022-10-05 NOTE — Progress Notes (Signed)
Labs are stable. Cholesterol/triglycerides are a little elevated - recommend heart healthy diet and regular exercise.

## 2022-10-07 ENCOUNTER — Encounter: Payer: Self-pay | Admitting: Neurology

## 2022-10-19 ENCOUNTER — Telehealth: Payer: Self-pay | Admitting: Family Medicine

## 2022-10-19 NOTE — Telephone Encounter (Signed)
Pt called to say she missed a call. Advised her that CMA was advising her that forms were completed, faxed back and confirmation received. Pt said her vm also downloaded and it said the same.

## 2022-10-19 NOTE — Telephone Encounter (Signed)
Pt called & stated that she received a physical exam at the time of her new pt appt w/ Ladona Ridgel. She mentioned that she gave a discount form for her insurance to [employer]. She would like to check the status of the form if it has been faxed to her insurance or if its still being reviewed from pcp. Please call & advise.

## 2022-10-19 NOTE — Telephone Encounter (Signed)
Silvio Pate, CMA 10/06/2022  9:26 AM EDT Back to Top    Patient made aware of results/recommendations.   Form completed for patient's health insurance and faxed with confirmation received.    Called patient to make her aware this was sent and a message was left letting her know. No answer. Left another voicemail. If patient calls back please inform her this was faxed on 10/06/2022.

## 2022-10-28 NOTE — Telephone Encounter (Signed)
Pt called back and stated that she didn't receive the form via fax. She requested for it to be sent via email this time at [email or web address]. If not, she provided the fax number which is:[phone] for it be faxed again. Please advise.

## 2022-10-31 NOTE — Telephone Encounter (Signed)
Form emailed as requested.

## 2022-11-02 NOTE — Telephone Encounter (Signed)
Pt called back and wanted to request if form can now be printed out for her to pick it up. Please advise when ready for pick up.

## 2022-11-03 NOTE — Telephone Encounter (Signed)
Form at front for pick up. LVM letting patient know.

## 2022-11-14 ENCOUNTER — Institutional Professional Consult (permissible substitution): Payer: 59 | Admitting: Neurology

## 2022-11-22 ENCOUNTER — Ambulatory Visit
Admission: RE | Admit: 2022-11-22 | Discharge: 2022-11-22 | Disposition: A | Discharge status: Home / Self Care | Payer: 59 | Source: Ambulatory Visit | Attending: Emergency Medicine | Admitting: Emergency Medicine

## 2022-11-22 VITALS — BP 115/75 | HR 89 | Temp 97.9°F | Resp 18

## 2022-11-22 DIAGNOSIS — D509 Iron deficiency anemia, unspecified: Secondary | ICD-10-CM | POA: Diagnosis not present

## 2022-11-22 DIAGNOSIS — J4521 Mild intermittent asthma with (acute) exacerbation: Secondary | ICD-10-CM | POA: Diagnosis not present

## 2022-11-22 DIAGNOSIS — J309 Allergic rhinitis, unspecified: Secondary | ICD-10-CM

## 2022-11-22 MED ORDER — ALBUTEROL SULFATE 2.5 MG/0.5ML IN NEBU: 1.0000 | INHALATION_SOLUTION | Freq: Four times a day (QID) | RESPIRATORY_TRACT | 0 refills | Status: DC | PRN

## 2022-11-22 MED ORDER — ALBUTEROL SULFATE (2.5 MG/3ML) 0.083% IN NEBU
2.5000 mg | INHALATION_SOLUTION | Freq: Once | RESPIRATORY_TRACT | Status: AC
  Administered 2022-11-22: 2.5 mg via RESPIRATORY_TRACT

## 2022-11-22 MED ORDER — FLUTICASONE PROPIONATE 50 MCG/ACT NA SUSP: 1.0000 | Freq: Every day | NASAL | 1 refills | Status: DC

## 2022-11-22 MED ORDER — ALBUTEROL SULFATE HFA 108 (90 BASE) MCG/ACT IN AERS: 2.0000 | INHALATION_SPRAY | Freq: Four times a day (QID) | RESPIRATORY_TRACT | 2 refills | Status: DC | PRN

## 2022-11-22 MED ORDER — METHYLPREDNISOLONE ACETATE 80 MG/ML IJ SUSP
60.0000 mg | Freq: Once | INTRAMUSCULAR | Status: AC
  Administered 2022-11-22: 60 mg via INTRAMUSCULAR

## 2022-11-22 MED ORDER — ARNUITY ELLIPTA 100 MCG/ACT IN AEPB
1.0000 | INHALATION_SPRAY | Freq: Every day | RESPIRATORY_TRACT | 0 refills | Status: AC
Start: 2022-11-22 — End: 2023-02-20

## 2022-11-22 NOTE — ED Triage Notes (Signed)
Pt presents with c/o fever, cough, trouble breathing, body aches X 2 wks.  States the cough has not gotten better and coughs more when she breathes in.

## 2022-11-22 NOTE — Discharge Instructions (Signed)
We will contact you the results of your CBC and iron study once it is complete, typically this takes 24 to 36 hours.  Initially a result will be posted to your MyChart account.  If there are any abnormal findings, you will be contacted by phone.  Please do your best to take your iron.  For treatment of your asthma, Please read below to learn more about the medications, dosages and frequencies that I recommend to help alleviate your symptoms and to get you feeling better soon:  Depo-Medrol IM (methylprednisolone):  To quickly address your significant respiratory inflammation, you were provided with an injection of Depo-Medrol in the office today.  You should continue to feel the full benefit of the steroid for the next 8 to 12 hours.    Flonase (fluticasone): This is a steroid nasal spray that used once daily, 1 spray in each nare.  This works best when used on a daily basis. This medication does not work well if it is only used when you think you need it.  After 3 to 5 days of use, you will notice significant reduction of the inflammation and mucus production that is currently being caused by exposure to allergens, whether seasonal or environmental.  The most common side effect of this medication is nosebleeds.  If you experience a nosebleed, please discontinue use for 1 week, then feel free to resume.  If you find that your insurance will not pay for this medication, please consider a different nasal steroids such as Nasonex (mometasone), or Nasacort (triamcinolone).   ProAir, Ventolin, Proventil (albuterol): This inhaled medication contains a short acting beta agonist bronchodilator.  This medication relaxes the smooth muscle of the airway in the lungs.  When these muscles are tight, breathing becomes more constricted.  The result of relaxation of the smooth muscle is increased air movement and improved work of breathing.  This is a short acting medication that can be used every 4-6 hours as needed for  increased work of breathing, shortness of breath, wheezing and excessive coughing.  It comes in the form of a handheld inhaler or nebulizer solution.  I recommended that for the next 3 to 4 days, this medication is used 4 times daily on a scheduled basis then decrease to twice daily and as needed until symptoms have completely resolved which I anticipate will be several weeks.  We have provided you with both the nebulized albuterol and handheld inhaler.   Arnuity (fluticasone): Please inhale 1 puff every day in the morning.  This inhaled medication contains a corticosteroid.  The inhaled steroid and this medication  is not absorbed into the body and will not cause side effects such as increased blood sugar levels, irritability, sleeplessness or weight gain.  Inhaled corticosteroid are sort of like topical steroid creams but, as you can imagine, it is not practical to attempt to rub a steroid cream inside of your lungs.     If symptoms have not meaningfully improved in the next 7 to 10 days, please return for repeat evaluation or follow-up with your regular provider.  If symptoms have worsened in the next 3 to 5 days, please return for repeat evaluation or follow-up with your regular provider.    Thank you for visiting urgent care today.  We appreciate the opportunity to participate in your care.

## 2022-11-22 NOTE — ED Provider Notes (Signed)
[facility]    CSN: [id number] Arrival date & time: 11/22/22  1101    HISTORY   Chief Complaint  Patient presents with   Cough    2nd trimester pregnant patient, with cough, dizziness, and fatigue for 2 weeks. - Entered by patient   Fever   Ear Pain   HPI Jeanne [name] is a pleasant, 22 y.o. female who presents to urgent care today. Pt presents with c/o fever, cough, trouble breathing, body aches X 2 wks. States the cough has not gotten better and coughs more when she breathes in.  Reports history of asthma, has not been using albuterol, states she has been on Advair in the past but not currently using.  Also reports history of allergies, not currently taking allergy medications.  Patient states she is 17 weeks and 6 days pregnant.  Patient states she has been feeling weak and dizzy as well, reports a history of iron deficiency anemia, last checked by her OB/GYN last month and states it was "low", was advised to take iron supplement daily but because she has been nauseated with her pregnancy has been unable to take this consistently.  Patient is with her mother-in-law today who states the patient appears to be very pale.  The history is provided by the patient.   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 Authorizing Provider  Cyanocobalamin (B-12 PO) Take by mouth daily.    [provider]  Ferrous Gluconate (IRON 27 PO) Take by mouth daily.    [provider]  Prenatal Vit-Fe Fumarate-FA (PRENATAL MULTIVITAMIN) TABS tablet Take 1 tablet by mouth daily at 12 noon.    [provider]    Family History Family History  Problem Relation Age of Onset   Schizophrenia Brother    Social History Social History   Tobacco Use   Smoking status: Never   Smokeless tobacco: Never  Vaping Use   Vaping status: Never Used  Substance Use Topics   Alcohol use: Never   Drug use: Never   Allergies   Strawberry (diagnostic)  Review of Systems Review of Systems Pertinent findings revealed after performing a 14 point review of systems has been noted in the history of present illness.  Physical Exam Vital Signs BP 115/75  Pulse 89   Temp 97.9 F (36.6 C) (Oral)   Resp 18   SpO2 94%   No data found.  Physical Exam Vitals and nursing note reviewed.  Constitutional:      General: She is awake. She is not in acute distress.    Appearance: Normal appearance. She is well-developed and well-groomed. She is not ill-appearing.  HENT:     Head: Normocephalic and atraumatic.     Salivary Glands: Right salivary gland is not diffusely enlarged or tender. Left salivary gland is not diffusely enlarged or tender.     Right Ear: Ear canal and external ear normal. No drainage. A middle ear effusion is present. There is no impacted cerumen. Tympanic membrane is bulging. Tympanic membrane is not injected or erythematous.     Left Ear: Ear canal and external ear normal. No drainage. A middle ear effusion is present. There is no impacted cerumen. Tympanic membrane  is bulging. Tympanic membrane is not injected or erythematous.     Ears:     Comments: Bilateral EACs normal, both TMs bulging with clear fluid    Nose: Rhinorrhea present. No nasal deformity, septal deviation, signs of injury, nasal tenderness, mucosal edema or congestion. Rhinorrhea is clear.     Right Nostril: Occlusion present. No foreign body, epistaxis or septal hematoma.     Left Nostril: Occlusion present. No foreign body, epistaxis or septal hematoma.     Right Turbinates: Enlarged, swollen and pale.     Left Turbinates: Enlarged, swollen and pale.     Right Sinus: No maxillary sinus tenderness or frontal sinus tenderness.     Left Sinus: No maxillary sinus tenderness or frontal sinus tenderness.     Mouth/Throat:     Lips: Pink. No lesions.     Mouth: Mucous membranes are moist. No oral lesions.     Pharynx: Oropharynx is clear. Uvula midline. No posterior oropharyngeal erythema or uvula swelling.     Tonsils: No tonsillar exudate. 0 on the right. 0 on the left.     Comments: Postnasal drip Eyes:     General: Lids are normal.        Right eye: No discharge.        Left eye: No discharge.     Extraocular Movements: Extraocular movements intact.     Conjunctiva/sclera: Conjunctivae normal.     Right eye: Right conjunctiva is not injected.     Left eye: Left conjunctiva is not injected.  Neck:     Trachea: Trachea and phonation normal.  Cardiovascular:     Rate and Rhythm: Normal rate and regular rhythm.     Pulses: Normal pulses.     Heart sounds: Normal heart sounds. No murmur heard.    No friction rub. No gallop.  Pulmonary:     Effort: Pulmonary effort is normal. No accessory muscle usage, prolonged expiration or respiratory distress.     Breath sounds: No stridor, decreased air movement or transmitted upper airway sounds. Examination of the right-upper field reveals wheezing. Examination of the left-upper field reveals wheezing. Examination of the right-middle field  reveals wheezing. Examination of the left-middle field reveals wheezing. Examination of the right-lower field reveals wheezing. Examination of the left-lower field reveals wheezing. Wheezing present. No decreased breath sounds, rhonchi or rales.     Comments: Repeat auscultation post nebulized bronchodilator revealed significant improvement of wheezing and work of breathing. Chest:     Chest wall: No tenderness.  Musculoskeletal:  General: Normal range of motion.     Cervical back: Normal range of motion and neck supple. Normal range of motion.  Lymphadenopathy:     Cervical: No cervical adenopathy.  Skin:    General: Skin is warm and dry.     Coloration: Skin is pale.  Neurological:     General: No focal deficit present.     Mental Status: She is alert and oriented to person, place, and time.  Psychiatric:        Mood and Affect: Mood normal.        Behavior: Behavior normal. Behavior is cooperative.     Visual Acuity Right Eye Distance:   Left Eye Distance:   Bilateral Distance:    Right Eye Near:   Left Eye Near:    Bilateral Near:     [facility] Couse / Diagnostics / Procedures:     Radiology No results found.  Procedures Procedures (including critical care time) EKG  Pending results:  Labs Reviewed  CBC WITH DIFFERENTIAL/PLATELET  IRON AND TIBC    Medications Ordered in UC: Medications  methylPREDNISolone acetate (DEPO-MEDROL) injection 60 mg (60 mg Intramuscular Given 11/22/22 1200)  albuterol (PROVENTIL) (2.5 MG/3ML) 0.083% nebulizer solution 2.5 mg (2.5 mg Nebulization Given 11/22/22 1205)    [facility] Diagnoses / Final Clinical Impressions(s)   I have reviewed the triage vital signs and the nursing notes.  Pertinent labs & imaging results that were available during my care of the patient were reviewed by me and considered in my medical decision making (see chart for details).    Final diagnoses:  Mild intermittent asthma with acute exacerbation in adult   Iron deficiency anemia, unspecified iron deficiency anemia type  Allergic rhinitis, unspecified seasonality, unspecified trigger   Patient provided with nebulized bronchodilator and Medrol injection during her visit today.  Repeat auscultation after nebulizer treatment revealed improvement of work of breathing.  Advised patient to continue short acting albuterol 4 times daily in the form of HFA or nebulizer, patient provided with the machine.  Patient also advised to begin fluticasone daily.  I provided patient with a topical nasal steroid spray for relief of allergy symptoms.  Patient advised we will contact her with the results of her CBC and iron levels and have encouraged her to redouble her efforts in taking her iron supplement every day.  Patient advised if feeling no better she should seek further evaluation either from her OB/GYN, here urgent care or the emergency room.  Please see discharge instructions below for details of plan of care as provided to patient. ED Prescriptions     Medication Sig Dispense Auth. Provider   albuterol (VENTOLIN HFA) 108 (90 Base) MCG/ACT inhaler Inhale 2 puffs into the lungs every 6 (six) hours as needed for wheezing or shortness of breath (Cough). 36 g Theadora Rama Scales, PA-C   Albuterol Sulfate 2.5 MG/0.5ML NEBU Inhale 0.5 mLs (2.5 mg total) into the lungs every 6 (six) hours as needed (SOB, Wheezing, Cough). 180 mL Theadora Rama Scales, PA-C   fluticasone (FLONASE) 50 MCG/ACT nasal spray Place 1 spray into both nostrils daily. 47.4 mL Theadora Rama Scales, PA-C   Fluticasone Furoate (ARNUITY ELLIPTA) 100 MCG/ACT AEPB Inhale 1 Inhalation into the lungs daily. 3 each Theadora Rama Scales, PA-C      PDMP not reviewed this encounter.  Pending results:  Labs Reviewed  CBC WITH DIFFERENTIAL/PLATELET  IRON AND TIBC    Discharge Instructions:   Discharge Instructions      We  will contact you the results of your CBC and iron study once it is  complete, typically this takes 24 to 36 hours.  Initially a result will be posted to your MyChart account.  If there are any abnormal findings, you will be contacted by phone.  Please do your best to take your iron.  For treatment of your asthma, Please read below to learn more about the medications, dosages and frequencies that I recommend to help alleviate your symptoms and to get you feeling better soon:  Depo-Medrol IM (methylprednisolone):  To quickly address your significant respiratory inflammation, you were provided with an injection of Depo-Medrol in the office today.  You should continue to feel the full benefit of the steroid for the next 8 to 12 hours.    Flonase (fluticasone): This is a steroid nasal spray that used once daily, 1 spray in each nare.  This works best when used on a daily basis. This medication does not work well if it is only used when you think you need it.  After 3 to 5 days of use, you will notice significant reduction of the inflammation and mucus production that is currently being caused by exposure to allergens, whether seasonal or environmental.  The most common side effect of this medication is nosebleeds.  If you experience a nosebleed, please discontinue use for 1 week, then feel free to resume.  If you find that your insurance will not pay for this medication, please consider a different nasal steroids such as Nasonex (mometasone), or Nasacort (triamcinolone).   ProAir, Ventolin, Proventil (albuterol): This inhaled medication contains a short acting beta agonist bronchodilator.  This medication relaxes the smooth muscle of the airway in the lungs.  When these muscles are tight, breathing becomes more constricted.  The result of relaxation of the smooth muscle is increased air movement and improved work of breathing.  This is a short acting medication that can be used every 4-6 hours as needed for increased work of breathing, shortness of breath, wheezing and excessive  coughing.  It comes in the form of a handheld inhaler or nebulizer solution.  I recommended that for the next 3 to 4 days, this medication is used 4 times daily on a scheduled basis then decrease to twice daily and as needed until symptoms have completely resolved which I anticipate will be several weeks.  We have provided you with both the nebulized albuterol and handheld inhaler.   Arnuity (fluticasone): Please inhale 1 puff every day in the morning.  This inhaled medication contains a corticosteroid.  The inhaled steroid and this medication  is not absorbed into the body and will not cause side effects such as increased blood sugar levels, irritability, sleeplessness or weight gain.  Inhaled corticosteroid are sort of like topical steroid creams but, as you can imagine, it is not practical to attempt to rub a steroid cream inside of your lungs.     If symptoms have not meaningfully improved in the next 7 to 10 days, please return for repeat evaluation or follow-up with your regular provider.  If symptoms have worsened in the next 3 to 5 days, please return for repeat evaluation or follow-up with your regular provider.    Thank you for visiting urgent care today.  We appreciate the opportunity to participate in your care.       Disposition Upon Discharge:  Condition: stable for discharge home  Patient presented with an acute illness with associated systemic symptoms and significant discomfort  requiring urgent management. In my opinion, this is a condition that a prudent lay person (someone who possesses an average knowledge of health and medicine) may potentially expect to result in complications if not addressed urgently such as respiratory distress, impairment of bodily function or dysfunction of bodily organs.   Routine symptom specific, illness specific and/or disease specific instructions were discussed with the patient and/or caregiver at length.   As such, the patient has been evaluated  and assessed, work-up was performed and treatment was provided in alignment with urgent care protocols and evidence based medicine.  Patient/parent/caregiver has been advised that the patient may require follow up for further testing and treatment if the symptoms continue in spite of treatment, as clinically indicated and appropriate.  Patient/parent/caregiver has been advised to return to the [facility] or PCP if no better; to PCP or the Emergency Department if new signs and symptoms develop, or if the current signs or symptoms continue to change or worsen for further workup, evaluation and treatment as clinically indicated and appropriate  The patient will follow up with their current PCP if and as advised. If the patient does not currently have a PCP we will assist them in obtaining one.   The patient may need specialty follow up if the symptoms continue, in spite of conservative treatment and management, for further workup, evaluation, consultation and treatment as clinically indicated and appropriate.  Patient/parent/caregiver verbalized understanding and agreement of plan as discussed.  All questions were addressed during visit.  Please see discharge instructions below for further details of plan.  This office note has been dictated using Teaching laboratory technician.  Unfortunately, this method of dictation can sometimes lead to typographical or grammatical errors.  I apologize for your inconvenience in advance if this occurs.  Please do not hesitate to reach out to me if clarification is needed.      Theadora Rama Scales, PA-C 11/22/22 1239

## 2022-11-23 LAB — CBC WITH DIFFERENTIAL/PLATELET
Basophils Absolute: 0.1 10*3/uL (ref 0.0–0.2)
Basos: 1 %
EOS (ABSOLUTE): 0.1 10*3/uL (ref 0.0–0.4)
Eos: 1 %
Hematocrit: 39 % (ref 34.0–46.6)
Hemoglobin: 12.2 g/dL (ref 11.1–15.9)
Immature Grans (Abs): 0.2 10*3/uL — ABNORMAL HIGH (ref 0.0–0.1)
Immature Granulocytes: 2 %
Lymphocytes Absolute: 2.1 10*3/uL (ref 0.7–3.1)
Lymphs: 21 %
MCH: 25.7 pg — ABNORMAL LOW (ref 26.6–33.0)
MCHC: 31.3 g/dL — ABNORMAL LOW (ref 31.5–35.7)
MCV: 82 fL (ref 79–97)
Monocytes Absolute: 0.6 10*3/uL (ref 0.1–0.9)
Monocytes: 6 %
Neutrophils Absolute: 7.3 10*3/uL — ABNORMAL HIGH (ref 1.4–7.0)
Neutrophils: 69 %
Platelets: 577 10*3/uL — ABNORMAL HIGH (ref 150–450)
RBC: 4.74 x10E6/uL (ref 3.77–5.28)
RDW: 13.8 % (ref 11.7–15.4)
WBC: 10.2 10*3/uL (ref 3.4–10.8)

## 2022-11-23 LAB — IRON AND TIBC
Iron Saturation: 14 % — ABNORMAL LOW (ref 15–55)
Iron: 48 ug/dL (ref 27–159)
Total Iron Binding Capacity: 353 ug/dL (ref 250–450)
UIBC: 305 ug/dL (ref 131–425)

## 2022-11-24 ENCOUNTER — Telehealth: Payer: Self-pay

## 2022-11-24 MED ORDER — ALBUTEROL SULFATE (2.5 MG/3ML) 0.083% IN NEBU: 2.5000 mg | INHALATION_SOLUTION | Freq: Four times a day (QID) | RESPIRATORY_TRACT | 0 refills | Status: DC | PRN

## 2022-11-24 NOTE — Telephone Encounter (Signed)
Spoke to patient and informed her a new script has been sent in.

## 2022-11-24 NOTE — Telephone Encounter (Signed)
New prescription sent. Please let the patient know.

## 2022-11-24 NOTE — Telephone Encounter (Signed)
Patient called and stated the pharmacy stated that Albuterol Sulfate 2.5 MG/0.5ML NEBU is on backorder.   Is requesting a different script.

## 2022-11-30 ENCOUNTER — Ambulatory Visit: Payer: 59 | Admitting: Family Medicine

## 2022-12-01 ENCOUNTER — Institutional Professional Consult (permissible substitution): Payer: 59 | Admitting: Neurology

## 2023-03-31 LAB — OB RESULTS CONSOLE GBS: GBS: NEGATIVE

## 2023-04-07 ENCOUNTER — Encounter (HOSPITAL_COMMUNITY): Payer: Self-pay | Admitting: *Deleted

## 2023-04-07 NOTE — Patient Instructions (Signed)
 Jeanne [name]  04/07/2023   Your procedure is scheduled on:  3.13.2025  Arrive at 0530 at Entrance C on [address] at [facility]  and [address]. You are invited to use the FREE valet parking or use the Visitor's parking deck.  Pick up the phone at the desk and dial [phone].  Call this number if you have problems the morning of surgery: [phone]  Remember:   Do not eat food:(After Midnight) Desps de medianoche.  You may drink clear liquids until arrival at __0530___.  Clear liquids means a liquid you can see thru.  It can have color such as Cola or Kool aid.  Tea is OK and coffee as long as no milk or creamer of any kind.  Take these medicines the morning of surgery with A SIP OF WATER:  Bring your inhaler   Do not wear jewelry, make-up or nail polish.  Do not wear lotions, powders, or perfumes. Do not wear deodorant.  Do not shave 48 hours prior to surgery.  Do not bring valuables to the hospital.  [facility] is not   responsible for any belongings or valuables brought to the hospital.  Contacts, dentures or bridgework may not be worn into surgery.  Leave suitcase in the car. After surgery it may be brought to your room.  For patients admitted to the hospital, checkout time is 11:00 AM the day of              discharge.      Please read over the following fact sheets that you were given:     Preparing for Surgery

## 2023-04-10 ENCOUNTER — Telehealth (HOSPITAL_COMMUNITY): Payer: Self-pay | Admitting: *Deleted

## 2023-04-10 NOTE — H&P (Signed)
 Jeanne [name] is a 23 y.o. female presenting for scheduled repeat cesarean section. She has a prior CS s/s arrest of dilation of a 9#13 infant. Current pregnancy c/b RH negative (FOB also RH negative), Rubella non-immune, A2GDM well c/w glyburide 2.5mg  at bedtime and narcolepsy. Baby has been breech. Desires RCS regardless of presentation of fetus. RR BOY.   OB History     Gravida  3   Para  1   Term  1   Preterm      AB  1   Living  1      SAB  1   IAB      Ectopic      Multiple  0   Live Births  1          Past Medical History:  Diagnosis Date   Anemia    Asthma    Narcolepsy    Past Surgical History:  Procedure Laterality Date   CESAREAN SECTION N/A 08/13/2021   Procedure: CESAREAN SECTION;  Surgeon: Marcelle Overlie, MD;  Location: [facility] LD ORS;  Service: Obstetrics;  Laterality: N/A;   WISDOM TOOTH EXTRACTION     Family History: family history includes Schizophrenia in her brother. Social History:  reports that she has never smoked. She has never used smokeless tobacco. She reports that she does not drink alcohol and does not use drugs.     Maternal Diabetes: Yes:  Diabetes Type:  Insulin/Medication controlled Genetic Screening: Normal Maternal Ultrasounds/Referrals: Normal Fetal Ultrasounds or other Referrals:  None Maternal Substance Abuse:  No Significant Maternal Medications:  Meds include: Other:  glyburide Significant Maternal Lab Results:  None Number of Prenatal Visits:greater than 3 verified prenatal visits  Review of Systems History   currently breastfeeding. Exam Physical Exam  (from office) NAD, A&O NWOB Abd soft, nondistended, gravid  Prenatal labs: ABO, Rh:   Antibody:   Rubella: Nonimmune (08/12 0000) RPR: Nonreactive (08/12 0000)  HBsAg: Negative (08/12 0000)  HIV: Non-reactive (08/12 0000)  GBS: Negative/-- (02/21 0000)   Assessment/Plan:  23 yo G3P1011 presenting for scheduled repeat CS. Risks discussed including  infection, bleeding, damage to surrounding structures, the need for additional procedures including hysterectomy, and the possibility of uterine rupture with neonatal morbidity/mortality, scarring, and abnormal placentation with subsequent pregnancies. Patient agrees to proceed.  2g ancef on call to OR. MMR pp.    Madelaine Etienne Corran Lalone 04/10/2023, 1:50 PM

## 2023-04-10 NOTE — Telephone Encounter (Signed)
 Preadmission screen

## 2023-04-11 ENCOUNTER — Telehealth (HOSPITAL_COMMUNITY): Payer: Self-pay | Admitting: *Deleted

## 2023-04-11 NOTE — Telephone Encounter (Signed)
 Preadmission screen

## 2023-04-12 ENCOUNTER — Encounter (HOSPITAL_COMMUNITY): Payer: Self-pay

## 2023-04-18 ENCOUNTER — Encounter (HOSPITAL_COMMUNITY)
Admission: RE | Admit: 2023-04-18 | Discharge: 2023-04-18 | Disposition: A | Discharge status: Home / Self Care | Payer: 59 | Source: Ambulatory Visit | Attending: Obstetrics and Gynecology | Admitting: Obstetrics and Gynecology

## 2023-04-18 DIAGNOSIS — Z3483 Encounter for supervision of other normal pregnancy, third trimester: Secondary | ICD-10-CM | POA: Insufficient documentation

## 2023-04-18 LAB — RPR: RPR Ser Ql: NONREACTIVE

## 2023-04-18 LAB — CBC
HCT: 34.5 % — ABNORMAL LOW (ref 36.0–46.0)
Hemoglobin: 11.4 g/dL — ABNORMAL LOW (ref 12.0–15.0)
MCH: 25.9 pg — ABNORMAL LOW (ref 26.0–34.0)
MCHC: 33 g/dL (ref 30.0–36.0)
MCV: 78.2 fL — ABNORMAL LOW (ref 80.0–100.0)
Platelets: 383 10*3/uL (ref 150–400)
RBC: 4.41 MIL/uL (ref 3.87–5.11)
RDW: 14.6 % (ref 11.5–15.5)
WBC: 10.4 10*3/uL (ref 4.0–10.5)
nRBC: 0 % (ref 0.0–0.2)

## 2023-04-18 LAB — TYPE AND SCREEN
ABO/RH(D): B NEG
Antibody Screen: NEGATIVE

## 2023-04-19 NOTE — Anesthesia Preprocedure Evaluation (Signed)
 Anesthesia Evaluation  Patient identified by MRN, date of birth, ID band Patient awake    Reviewed: Allergy & Precautions, NPO status , Patient's Chart, lab work & pertinent test results  History of Anesthesia Complications Negative for: history of anesthetic complications  Airway Mallampati: II  TM Distance: >3 FB Neck ROM: Full    Dental   Pulmonary neg shortness of breath, asthma (no recent inhaler use) , neg sleep apnea, neg COPD, neg recent URI   Pulmonary exam normal breath sounds clear to auscultation       Cardiovascular negative cardio ROS  Rhythm:Regular Rate:Normal     Neuro/Psych        narcolepsynegative neurological ROS     GI/Hepatic negative GI ROS, Neg liver ROS,,,  Endo/Other  diabetes, Gestational, Oral Hypoglycemic Agents    Renal/GU negative Renal ROS     Musculoskeletal   Abdominal   Peds  Hematology  (+) Blood dyscrasia, anemia Lab Results      Component                Value               Date                      WBC                      10.4                04/18/2023                HGB                      11.4 (L)            04/18/2023                HCT                      34.5 (L)            04/18/2023                MCV                      78.2 (L)            04/18/2023                PLT                      383                 04/18/2023              Anesthesia Other Findings Previous c-section x1  Reproductive/Obstetrics                             Anesthesia Physical Anesthesia Plan  ASA: 2  Anesthesia Plan: Spinal   Post-op Pain Management:    Induction: Intravenous  PONV Risk Score and Plan: 2 and Ondansetron, Dexamethasone and Treatment may vary due to age or medical condition  Airway Management Planned: Natural Airway  Additional Equipment:   Intra-op Plan:   Post-operative Plan:   Informed Consent: I have reviewed the patients  History and Physical, chart, labs and discussed the procedure including the risks, benefits and  alternatives for the proposed anesthesia with the patient or authorized representative who has indicated his/her understanding and acceptance.     Dental advisory given  Plan Discussed with: CRNA and Anesthesiologist  Anesthesia Plan Comments: (I have discussed risks of neuraxial anesthesia including but not limited to infection, bleeding, nerve injury, back pain, headache, seizures, and failure of block. Patient denies bleeding disorders and is not currently anticoagulated. Labs have been reviewed. Risks and benefits discussed. All patient's questions answered.  )        Anesthesia Quick Evaluation

## 2023-04-20 ENCOUNTER — Encounter (HOSPITAL_COMMUNITY)
Admission: RE | Disposition: A | Discharge status: Home / Self Care | Payer: Self-pay | Source: Home / Self Care | Attending: Obstetrics and Gynecology

## 2023-04-20 ENCOUNTER — Inpatient Hospital Stay (HOSPITAL_COMMUNITY)
Admission: RE | Admit: 2023-04-20 | Discharge: 2023-04-22 | DRG: 788 | Disposition: A | Discharge status: Home / Self Care | Payer: 59 | Attending: Obstetrics and Gynecology | Admitting: Obstetrics and Gynecology

## 2023-04-20 ENCOUNTER — Inpatient Hospital Stay (HOSPITAL_COMMUNITY): Admitting: Anesthesiology

## 2023-04-20 ENCOUNTER — Other Ambulatory Visit: Payer: Self-pay

## 2023-04-20 ENCOUNTER — Encounter (HOSPITAL_COMMUNITY): Payer: Self-pay | Admitting: Obstetrics and Gynecology

## 2023-04-20 DIAGNOSIS — O34211 Maternal care for low transverse scar from previous cesarean delivery: Secondary | ICD-10-CM

## 2023-04-20 DIAGNOSIS — O24425 Gestational diabetes mellitus in childbirth, controlled by oral hypoglycemic drugs: Secondary | ICD-10-CM | POA: Diagnosis present

## 2023-04-20 DIAGNOSIS — O9902 Anemia complicating childbirth: Secondary | ICD-10-CM | POA: Diagnosis present

## 2023-04-20 DIAGNOSIS — O321XX0 Maternal care for breech presentation, not applicable or unspecified: Secondary | ICD-10-CM

## 2023-04-20 DIAGNOSIS — Z23 Encounter for immunization: Secondary | ICD-10-CM | POA: Diagnosis not present

## 2023-04-20 DIAGNOSIS — Z3A39 39 weeks gestation of pregnancy: Secondary | ICD-10-CM

## 2023-04-20 DIAGNOSIS — O26893 Other specified pregnancy related conditions, third trimester: Secondary | ICD-10-CM | POA: Diagnosis present

## 2023-04-20 DIAGNOSIS — Z6791 Unspecified blood type, Rh negative: Secondary | ICD-10-CM | POA: Diagnosis not present

## 2023-04-20 DIAGNOSIS — O24424 Gestational diabetes mellitus in childbirth, insulin controlled: Secondary | ICD-10-CM | POA: Diagnosis not present

## 2023-04-20 DIAGNOSIS — Z3490 Encounter for supervision of normal pregnancy, unspecified, unspecified trimester: Principal | ICD-10-CM

## 2023-04-20 LAB — GLUCOSE, CAPILLARY
Glucose-Capillary: 114 mg/dL — ABNORMAL HIGH (ref 70–99)
Glucose-Capillary: 69 mg/dL — ABNORMAL LOW (ref 70–99)
Glucose-Capillary: 73 mg/dL (ref 70–99)

## 2023-04-20 SURGERY — Surgical Case
Anesthesia: Spinal

## 2023-04-20 MED ORDER — FENTANYL CITRATE (PF) 100 MCG/2ML IJ SOLN
INTRAMUSCULAR | Status: DC | PRN
  Administered 2023-04-20: 15 ug via INTRATHECAL

## 2023-04-20 MED ORDER — DEXAMETHASONE SODIUM PHOSPHATE 10 MG/ML IJ SOLN
INTRAMUSCULAR | Status: DC | PRN
  Administered 2023-04-20: 10 mg via INTRAVENOUS

## 2023-04-20 MED ORDER — CEFAZOLIN SODIUM-DEXTROSE 2-4 GM/100ML-% IV SOLN
2.0000 g | INTRAVENOUS | Status: AC
  Administered 2023-04-20: 2 g via INTRAVENOUS

## 2023-04-20 MED ORDER — PRENATAL MULTIVITAMIN CH
1.0000 | ORAL_TABLET | Freq: Every day | ORAL | Status: DC
  Administered 2023-04-21 – 2023-04-22 (×2): 1 via ORAL
  Filled 2023-04-20 (×2): qty 1

## 2023-04-20 MED ORDER — DEXTROSE 50 % IV SOLN
12.5000 g | INTRAVENOUS | Status: AC
  Administered 2023-04-20: 12.5 g via INTRAVENOUS

## 2023-04-20 MED ORDER — LACTATED RINGERS IV SOLN: INTRAVENOUS | Status: DC

## 2023-04-20 MED ORDER — DIPHENHYDRAMINE HCL 25 MG PO CAPS: 25.0000 mg | ORAL_CAPSULE | Freq: Four times a day (QID) | ORAL | Status: DC | PRN

## 2023-04-20 MED ORDER — SENNOSIDES-DOCUSATE SODIUM 8.6-50 MG PO TABS
2.0000 | ORAL_TABLET | Freq: Every day | ORAL | Status: DC
  Administered 2023-04-21 – 2023-04-22 (×2): 2 via ORAL
  Filled 2023-04-20 (×2): qty 2

## 2023-04-20 MED ORDER — KETOROLAC TROMETHAMINE 30 MG/ML IJ SOLN
30.0000 mg | Freq: Four times a day (QID) | INTRAMUSCULAR | Status: AC
  Administered 2023-04-20 – 2023-04-21 (×4): 30 mg via INTRAVENOUS
  Filled 2023-04-20 (×4): qty 1

## 2023-04-20 MED ORDER — OXYTOCIN-SODIUM CHLORIDE 30-0.9 UT/500ML-% IV SOLN
2.5000 [IU]/h | INTRAVENOUS | Status: AC
  Administered 2023-04-20: 2.5 [IU]/h via INTRAVENOUS
  Filled 2023-04-20: qty 500

## 2023-04-20 MED ORDER — HYDROMORPHONE HCL 1 MG/ML IJ SOLN: 0.2000 mg | INTRAMUSCULAR | Status: DC | PRN

## 2023-04-20 MED ORDER — SODIUM CHLORIDE 0.9 % IR SOLN
Status: DC | PRN
  Administered 2023-04-20: 1

## 2023-04-20 MED ORDER — SOD CITRATE-CITRIC ACID 500-334 MG/5ML PO SOLN
30.0000 mL | ORAL | Status: AC
  Administered 2023-04-20: 30 mL via ORAL

## 2023-04-20 MED ORDER — OXYTOCIN-SODIUM CHLORIDE 30-0.9 UT/500ML-% IV SOLN
INTRAVENOUS | Status: DC | PRN
  Administered 2023-04-20: 300 mL via INTRAVENOUS

## 2023-04-20 MED ORDER — KETOROLAC TROMETHAMINE 30 MG/ML IJ SOLN
INTRAMUSCULAR | Status: AC
  Filled 2023-04-20: qty 1

## 2023-04-20 MED ORDER — ONDANSETRON HCL 4 MG/2ML IJ SOLN
INTRAMUSCULAR | Status: DC | PRN
  Administered 2023-04-20: 4 mg via INTRAVENOUS

## 2023-04-20 MED ORDER — MORPHINE SULFATE (PF) 0.5 MG/ML IJ SOLN
INTRAMUSCULAR | Status: AC
  Filled 2023-04-20: qty 10

## 2023-04-20 MED ORDER — SIMETHICONE 80 MG PO CHEW: 80.0000 mg | CHEWABLE_TABLET | ORAL | Status: DC | PRN

## 2023-04-20 MED ORDER — OXYTOCIN-SODIUM CHLORIDE 30-0.9 UT/500ML-% IV SOLN
INTRAVENOUS | Status: AC
  Filled 2023-04-20: qty 500

## 2023-04-20 MED ORDER — OXYCODONE HCL 5 MG PO TABS
5.0000 mg | ORAL_TABLET | ORAL | Status: DC | PRN
  Administered 2023-04-22: 5 mg via ORAL
  Filled 2023-04-20: qty 1

## 2023-04-20 MED ORDER — LIDOCAINE 2% (20 MG/ML) 5 ML SYRINGE
INTRAMUSCULAR | Status: AC
  Filled 2023-04-20: qty 5

## 2023-04-20 MED ORDER — ACETAMINOPHEN 10 MG/ML IV SOLN
INTRAVENOUS | Status: AC
  Filled 2023-04-20: qty 100

## 2023-04-20 MED ORDER — TRANEXAMIC ACID-NACL 1000-0.7 MG/100ML-% IV SOLN
INTRAVENOUS | Status: AC
  Filled 2023-04-20: qty 100

## 2023-04-20 MED ORDER — DEXTROSE 50 % IV SOLN
INTRAVENOUS | Status: AC
  Filled 2023-04-20: qty 50

## 2023-04-20 MED ORDER — WITCH HAZEL-GLYCERIN EX PADS: 1.0000 | MEDICATED_PAD | CUTANEOUS | Status: DC | PRN

## 2023-04-20 MED ORDER — ACETAMINOPHEN 500 MG PO TABS
1000.0000 mg | ORAL_TABLET | Freq: Four times a day (QID) | ORAL | Status: DC
  Administered 2023-04-20 – 2023-04-22 (×8): 1000 mg via ORAL
  Filled 2023-04-20 (×8): qty 2

## 2023-04-20 MED ORDER — ACETAMINOPHEN 10 MG/ML IV SOLN: 1000.0000 mg | Freq: Once | INTRAVENOUS | Status: DC | PRN

## 2023-04-20 MED ORDER — MENTHOL 3 MG MT LOZG: 1.0000 | LOZENGE | OROMUCOSAL | Status: DC | PRN

## 2023-04-20 MED ORDER — OXYCODONE HCL 5 MG PO TABS: 5.0000 mg | ORAL_TABLET | Freq: Once | ORAL | Status: DC | PRN

## 2023-04-20 MED ORDER — ONDANSETRON HCL 4 MG/2ML IJ SOLN
INTRAMUSCULAR | Status: AC
  Filled 2023-04-20: qty 2

## 2023-04-20 MED ORDER — OXYCODONE HCL 5 MG/5ML PO SOLN: 5.0000 mg | Freq: Once | ORAL | Status: DC | PRN

## 2023-04-20 MED ORDER — ZOLPIDEM TARTRATE 5 MG PO TABS: 5.0000 mg | ORAL_TABLET | Freq: Every evening | ORAL | Status: DC | PRN

## 2023-04-20 MED ORDER — IBUPROFEN 600 MG PO TABS
600.0000 mg | ORAL_TABLET | Freq: Four times a day (QID) | ORAL | Status: DC
  Administered 2023-04-21 – 2023-04-22 (×4): 600 mg via ORAL
  Filled 2023-04-20 (×4): qty 1

## 2023-04-20 MED ORDER — ACETAMINOPHEN 10 MG/ML IV SOLN
INTRAVENOUS | Status: DC | PRN
Start: 2023-04-20 — End: 2023-04-20
  Administered 2023-04-20: 1000 mg via INTRAVENOUS

## 2023-04-20 MED ORDER — COCONUT OIL OIL
1.0000 | TOPICAL_OIL | Status: DC | PRN
  Administered 2023-04-22: 1 via TOPICAL

## 2023-04-20 MED ORDER — FENTANYL CITRATE (PF) 100 MCG/2ML IJ SOLN
INTRAMUSCULAR | Status: AC
  Filled 2023-04-20: qty 2

## 2023-04-20 MED ORDER — KETOROLAC TROMETHAMINE 30 MG/ML IJ SOLN
30.0000 mg | Freq: Once | INTRAMUSCULAR | Status: AC | PRN
  Administered 2023-04-20: 30 mg via INTRAVENOUS

## 2023-04-20 MED ORDER — BUPIVACAINE IN DEXTROSE 0.75-8.25 % IT SOLN
INTRATHECAL | Status: DC | PRN
  Administered 2023-04-20: 1.6 mL via INTRATHECAL

## 2023-04-20 MED ORDER — SOD CITRATE-CITRIC ACID 500-334 MG/5ML PO SOLN
ORAL | Status: AC
  Filled 2023-04-20: qty 30

## 2023-04-20 MED ORDER — DIBUCAINE (PERIANAL) 1 % EX OINT: 1.0000 | TOPICAL_OINTMENT | CUTANEOUS | Status: DC | PRN

## 2023-04-20 MED ORDER — STERILE WATER FOR IRRIGATION IR SOLN
Status: DC | PRN
  Administered 2023-04-20: 1000 mL

## 2023-04-20 MED ORDER — MORPHINE SULFATE (PF) 0.5 MG/ML IJ SOLN
INTRAMUSCULAR | Status: DC | PRN
  Administered 2023-04-20: 150 ug via INTRATHECAL

## 2023-04-20 MED ORDER — SIMETHICONE 80 MG PO CHEW
80.0000 mg | CHEWABLE_TABLET | Freq: Three times a day (TID) | ORAL | Status: DC
  Administered 2023-04-20 – 2023-04-22 (×5): 80 mg via ORAL
  Filled 2023-04-20 (×5): qty 1

## 2023-04-20 MED ORDER — SODIUM CHLORIDE 0.9 % IV SOLN: 500.0000 mg | INTRAVENOUS | Status: DC

## 2023-04-20 MED ORDER — PHENYLEPHRINE HCL-NACL 20-0.9 MG/250ML-% IV SOLN
INTRAVENOUS | Status: DC | PRN
  Administered 2023-04-20: 60 ug/min via INTRAVENOUS

## 2023-04-20 MED ORDER — PHENYLEPHRINE HCL-NACL 20-0.9 MG/250ML-% IV SOLN
INTRAVENOUS | Status: AC
  Filled 2023-04-20: qty 250

## 2023-04-20 MED ORDER — FENTANYL CITRATE (PF) 100 MCG/2ML IJ SOLN: 25.0000 ug | INTRAMUSCULAR | Status: DC | PRN

## 2023-04-20 MED ORDER — CEFAZOLIN SODIUM-DEXTROSE 2-4 GM/100ML-% IV SOLN
INTRAVENOUS | Status: AC
  Filled 2023-04-20: qty 100

## 2023-04-20 MED ORDER — TRANEXAMIC ACID-NACL 1000-0.7 MG/100ML-% IV SOLN
1000.0000 mg | Freq: Once | INTRAVENOUS | Status: AC
  Administered 2023-04-20: 1000 mg via INTRAVENOUS

## 2023-04-20 SURGICAL SUPPLY — 31 items
BENZOIN TINCTURE PRP APPL 2/3 (GAUZE/BANDAGES/DRESSINGS) ×1 IMPLANT
CHLORAPREP W/TINT 26 (MISCELLANEOUS) ×2 IMPLANT
CLAMP UMBILICAL CORD (MISCELLANEOUS) ×1 IMPLANT
CLOTH BEACON ORANGE TIMEOUT ST (SAFETY) ×1 IMPLANT
CLSR STERI-STRIP ANTIMIC 1/2X4 (GAUZE/BANDAGES/DRESSINGS) ×1 IMPLANT
DRSG OPSITE POSTOP 4X10 (GAUZE/BANDAGES/DRESSINGS) ×1 IMPLANT
ELECT REM PT RETURN 9FT ADLT (ELECTROSURGICAL) ×1 IMPLANT
ELECTRODE REM PT RTRN 9FT ADLT (ELECTROSURGICAL) ×1 IMPLANT
EXTRACTOR VACUUM KIWI (MISCELLANEOUS) IMPLANT
GAUZE PAD ABD 7.5X8 STRL (GAUZE/BANDAGES/DRESSINGS) IMPLANT
GAUZE SPONGE 4X4 12PLY STRL LF (GAUZE/BANDAGES/DRESSINGS) IMPLANT
GLOVE BIO SURGEON STRL SZ 6.5 (GLOVE) ×1 IMPLANT
GLOVE BIOGEL PI IND STRL 6.5 (GLOVE) ×1 IMPLANT
GLOVE BIOGEL PI IND STRL 7.0 (GLOVE) ×2 IMPLANT
GOWN STRL REUS W/TWL LRG LVL3 (GOWN DISPOSABLE) ×2 IMPLANT
KIT ABG SYR 3ML LUER SLIP (SYRINGE) ×1 IMPLANT
MAT PREVALON FULL STRYKER (MISCELLANEOUS) IMPLANT
NDL HYPO 25X5/8 SAFETYGLIDE (NEEDLE) ×1 IMPLANT
NEEDLE HYPO 25X5/8 SAFETYGLIDE (NEEDLE) ×1 IMPLANT
NS IRRIG 1000ML POUR BTL (IV SOLUTION) ×1 IMPLANT
PACK C SECTION WH (CUSTOM PROCEDURE TRAY) ×1 IMPLANT
PAD OB MATERNITY 4.3X12.25 (PERSONAL CARE ITEMS) ×1 IMPLANT
STRIP CLOSURE SKIN 1/2X4 (GAUZE/BANDAGES/DRESSINGS) IMPLANT
SUT PLAIN 0 NONE (SUTURE) IMPLANT
SUT PLAIN ABS 2-0 CT1 27XMFL (SUTURE) ×1 IMPLANT
SUT VIC AB 0 CT1 36 (SUTURE) ×1 IMPLANT
SUT VIC AB 0 CTX36XBRD ANBCTRL (SUTURE) ×2 IMPLANT
SUT VIC AB 4-0 PS2 27 (SUTURE) ×1 IMPLANT
TOWEL OR 17X24 6PK STRL BLUE (TOWEL DISPOSABLE) ×1 IMPLANT
TRAY FOLEY W/BAG SLVR 14FR LF (SET/KITS/TRAYS/PACK) IMPLANT
WATER STERILE IRR 1000ML POUR (IV SOLUTION) ×1 IMPLANT

## 2023-04-20 NOTE — Anesthesia Procedure Notes (Signed)
 Spinal  Staffing Performed by: Linton Rump, MD Authorized by: Linton Rump, MD

## 2023-04-20 NOTE — Progress Notes (Signed)
No updates to above H&P. Patient arrived NPO and was consented in PACU. Risks again discussed, all questions answered, and consent signed. Proceed with above surgery.    Kailo Kosik MD  

## 2023-04-20 NOTE — Anesthesia Postprocedure Evaluation (Signed)
 Anesthesia Post Note  Patient: Carolynn Lunt  Procedure(s) Performed: REPEAT CESAREAN SECTION EDC: 04-26-23 ALLERG: NKDA PREVIOUS X 1     Patient location during evaluation: PACU Anesthesia Type: Spinal Level of consciousness: awake Pain management: pain level controlled Vital Signs Assessment: post-procedure vital signs reviewed and stable Respiratory status: spontaneous breathing, respiratory function stable and nonlabored ventilation Cardiovascular status: blood pressure returned to baseline and stable Postop Assessment: no headache, no backache and no apparent nausea or vomiting Anesthetic complications: no   No notable events documented.  Last Vitals:  Vitals:   04/20/23 0945 04/20/23 0954  BP: 101/61 109/74  Pulse: 71 75  Resp: 18 16  Temp: (!) 36.1 C 36.5 C  SpO2:  98%    Last Pain:  Vitals:   04/20/23 1000  TempSrc:   PainSc: 0-No pain   Pain Goal:                Epidural/Spinal Function Cutaneous sensation: Tingles (04/20/23 1000), Patient able to flex knees: No (04/20/23 1000), Patient able to lift hips off bed: No (04/20/23 1000), Back pain beyond tenderness at insertion site: No (04/20/23 1000), Progressively worsening motor and/or sensory loss: No (04/20/23 1000), Bowel and/or bladder incontinence post epidural: No (04/20/23 1000)  Linton Rump

## 2023-04-20 NOTE — Anesthesia Procedure Notes (Signed)
 Spinal  Patient location during procedure: OR Start time: 04/20/2023 7:35 AM End time: 04/20/2023 7:41 AM Reason for block: surgical anesthesia Staffing Performed: anesthesiologist and other anesthesia staff  Anesthesiologist: Linton Rump, MD Other anesthesia staff: Brooks Sailors, RN Performed by: Linton Rump, MD Authorized by: Linton Rump, MD   Preanesthetic Checklist Completed: patient identified, IV checked, site marked, risks and benefits discussed, surgical consent, monitors and equipment checked, pre-op evaluation and timeout performed Spinal Block Patient position: sitting Prep: DuraPrep Patient monitoring: blood pressure and continuous pulse ox Approach: midline Location: L3-4 Injection technique: single-shot Needle Needle type: Pencan  Needle gauge: 24 G Needle length: 9 cm Assessment Sensory level: T4 Additional Notes Risks and benefits of neuraxial anesthesia including, but not limited to, infection, bleeding, local anesthetic toxicity, headache, hypotension, back pain, block failure, etc. were discussed with the patient. The patient expressed understanding and consented to the procedure. I confirmed that the patient has no bleeding disorders and is not taking blood thinners. I confirmed the patient's last platelet count with the nurse. Monitors were applied. A time-out was performed immediately prior to the procedure. Sterile technique was used throughout the whole procedure.   1 attempt by SRNA. 2 attempts by MDA.

## 2023-04-20 NOTE — Op Note (Signed)
 PROCEDURE DATE: 04/20/23   PREOPERATIVE DIAGNOSIS: Repeat, breech   POSTOPERATIVE DIAGNOSIS: The same   PROCEDURE:    Repeat Low Transverse Cesarean Section   SURGEON:  Dr. Belva Agee  ASSISTANT: Dr. Anette Riedel  An experienced assistant was required given the standard of surgical care given the complexity of the case.  This assistant was needed for exposure, dissection, suctioning, retraction, instrument exchange, assisting with delivery with administration of fundal pressure, and for overall help during the procedure.    INDICATIONS: This is a 23 yo G2P1 at 12 wga requiring cesarean section secondary to desires RCS. Also noted to be breech. Regardless, wanted RCS.  Decision made to proceed with LTCS. The risks of cesarean section discussed with the patient included but were not limited to: bleeding which may require transfusion or reoperation; infection which may require antibiotics; injury to bowel, bladder, ureters or other surrounding organs; injury to the fetus; need for additional procedures including hysterectomy in the event of a life-threatening hemorrhage; placental abnormalities wth subsequent pregnancies, incisional problems, thromboembolic phenomenon and other postoperative/anesthesia complications. The patient agreed with the proposed plan, giving informed consent for the procedure.     FINDINGS:  Viable female infant in breech presentation, APGARs pending,  Weight 9#2, Amniotic fluid clear,  Intact placenta, three vessel cord.  Grossly normal uterus. .   ANESTHESIA:    Epidural ESTIMATED BLOOD LOSS: 697cc SPECIMENS: Placenta for routine COMPLICATIONS: None immediate   PROCEDURE IN DETAIL:  The patient received intravenous antibiotics (2g Ancef) and had sequential compression devices applied to her lower extremities while in the preoperative area.  She was then taken to the operating room where epidural anesthesia was dosed up to surgical level and was found to be adequate. She  was then placed in a dorsal supine position with a leftward tilt, and prepped and draped in a sterile manner.  A foley catheter was placed into her bladder and attached to constant gravity.  After an adequate timeout was performed, a Pfannenstiel skin incision was made with scalpel and carried through to the underlying layer of fascia. The fascia was incised in the midline and this incision was extended bilaterally using the Mayo scissors. Kocher clamps were applied to the superior aspect of the fascial incision and the underlying rectus muscles were dissected off bluntly. A similar process was carried out on the inferior aspect of the facial incision. The rectus muscles were separated in the midline bluntly and the peritoneum was entered bluntly.  A bladder flap was created sharply and developed bluntly. A transverse hysterotomy was made with a scalpel and extended bilaterally bluntly. The bladder blade was then removed. The infant was successfully delivered breech with the typical maneuvers with ease, and cord was clamped and cut and infant was handed over to awaiting neonatology team. Uterine massage was then administered and the placenta delivered intact with three-vessel cord. Cord gases were taken. The uterus was cleared of clot and debris.  The hysterotomy was closed with 0 vicryl.  A second imbricating suture of 0-vicryl was used to reinforce the incision and aid in hemostasis.The fascia was closed with 0-Vicryl in a running fashion with good restoration of anatomy.  The subcutaneus tissue was irrigated and was reapproximated using three interrupted plain gut stitches.  The skin was closed with 4-0 Vicryl in a subcuticular fashion.  All surgical site and was hemostatic at end of procedure) without any further bleeding on exam.    It's a boy - "[name]"!!   Pt tolerated  the procedure well. All sponge/lap/needle counts were correct  X 2. Pt taken to recovery room in stable condition.     Belva Agee  MD

## 2023-04-20 NOTE — Transfer of Care (Cosign Needed)
 Immediate Anesthesia Transfer of Care Note  Patient: Jeanne [name]  Procedure(s) Performed: REPEAT CESAREAN SECTION EDC: 04-26-23 ALLERG: NKDA PREVIOUS X 1  Patient Location: PACU  Anesthesia Type:Spinal  Level of Consciousness: awake, alert , and oriented  Airway & Oxygen Therapy: Patient Spontanous Breathing  Post-op Assessment: Report given to RN  Post vital signs: Reviewed and stable  Last Vitals:  Vitals Value Taken Time  BP 118/70 04/20/23 0854  Temp    Pulse 88 04/20/23 0855  Resp 23 04/20/23 0855  SpO2 96 % 04/20/23 0855  Vitals shown include unfiled device data.  Last Pain:  Vitals:   04/20/23 0615  TempSrc:   PainSc: 0-No pain         Complications: No notable events documented.

## 2023-04-20 NOTE — Lactation Note (Signed)
 This note was copied from a baby's chart. Lactation Consultation Note  Patient Name: Jeanne [name] [id number] Date: 04/20/2023 Age:24 hours  Reason for consult: Initial assessment;Term;Maternal endocrine disorder;Other (Comment) (LGA- 9 lbs, 2 oz)  P2, [redacted]w[redacted]d, breech, repeat C/S, GDM (Gylberide)  Initial LC visit. Mother cheerful and receptive tfor breastfeeding assistance. She states "[name]" has been breastfeeding well. She hopes to be able to breastfeed him much longer because breastfeeding did not go well with her first baby who is  now 22 months, weighed 9 lbs 13 oz, had low blood sugars, and received formula. She is very happy baby is latching.   Basic breastfeeding education and assisted mother with positioning baby in football hold. Baby eagerly rooting for the breast and latched well. Mother was taught hand expression, how to aligned baby to breast and signs of a deep latch with nutritive feeding. Discussed spoon feeding, as needed. Lab drew infant's blood sugar prior to [facility] assisting with feeding.  Mother encouraged to latch baby with feeding cues, place baby skin to skin if not latching, and call for assistance with breastfeeding as needed. Anticipate as baby approaches 24 hours of age, baby will breastfeed more often 8-12 plus times in 24 hours and may "cluster feed".     Mom made aware of O/P services, breastfeeding support groups, community resources, and our phone # for post-discharge questions.        Maternal Data Has patient been taught Hand Expression?: Yes Does the patient have breastfeeding experience prior to this delivery?: Yes How long did the patient breastfeed?: 3-4 weeks, "didn't go well, mostly formula fed"  Feeding Mother's Current Feeding Choice: Breast Milk  LATCH Score Latch: Grasps breast easily, tongue down, lips flanged, rhythmical sucking.  Audible Swallowing: A few with stimulation  Type of Nipple: Everted at rest and after  stimulation  Comfort (Breast/Nipple): Soft / non-tender  Hold (Positioning): Assistance needed to correctly position infant at breast and maintain latch.  LATCH Score: 8   Lactation Tools Discussed/Used    Interventions Interventions: Breast feeding basics reviewed;Assisted with latch;Skin to skin;Breast compression;Adjust position;Support pillows;Position options;DEBP;Education;LC Services brochure;CDC Guidelines for Breast Pump Cleaning;CDC milk storage guidelines  Discharge Pump: DEBP;Manual;Hands Free;Personal (Medela (plug in))  Consult Status Consult Status: Follow-up Date: 04/21/23 Follow-up type: In-patient    Christella Hartigan M 04/20/2023, 1:24 PM

## 2023-04-21 LAB — CBC
HCT: 27.9 % — ABNORMAL LOW (ref 36.0–46.0)
Hemoglobin: 9.1 g/dL — ABNORMAL LOW (ref 12.0–15.0)
MCH: 26 pg (ref 26.0–34.0)
MCHC: 32.6 g/dL (ref 30.0–36.0)
MCV: 79.7 fL — ABNORMAL LOW (ref 80.0–100.0)
Platelets: 320 10*3/uL (ref 150–400)
RBC: 3.5 MIL/uL — ABNORMAL LOW (ref 3.87–5.11)
RDW: 14.9 % (ref 11.5–15.5)
WBC: 15.2 10*3/uL — ABNORMAL HIGH (ref 4.0–10.5)
nRBC: 0 % (ref 0.0–0.2)

## 2023-04-21 LAB — GLUCOSE, CAPILLARY: Glucose-Capillary: 93 mg/dL (ref 70–99)

## 2023-04-21 MED ORDER — MEASLES, MUMPS & RUBELLA VAC IJ SOLR
0.5000 mL | Freq: Once | INTRAMUSCULAR | Status: AC
  Administered 2023-04-22: 0.5 mL via SUBCUTANEOUS
  Filled 2023-04-21: qty 0.5

## 2023-04-21 NOTE — Lactation Note (Signed)
 This note was copied from a baby's chart. Lactation Consultation Note  Patient Name: Jeanne [name] [id number] Date: 04/21/2023 Age:24 hours LC attempted to see MOB and infant MOB was asleep at this time. LC will attempt to follow up again with family later in her shift.    Maternal Data    Feeding    LATCH Score                    Lactation Tools Discussed/Used    Interventions    Discharge    Consult Status      [name] 04/21/2023, 8:21 PM

## 2023-04-21 NOTE — Progress Notes (Signed)
 Pt ambulating in hall. Pt started complaining of dizziness and nausea. Pt placed in wheelchair and taking back to room and placed in bed.  Pt vital signs WNL. Pt encouraged to increase po  fluids. Dr Langston Masker notified. No new orders given.

## 2023-04-21 NOTE — Progress Notes (Signed)
 Subjective: Postpartum Day 1: Cesarean Delivery Patient reports tolerating PO and no problems voiding.  Desires circ.  Objective: Vital signs in last 24 hours: Temp:  [97 F (36.1 C)-98.8 F (37.1 C)] 98.4 F (36.9 C) (03/14 0529) Pulse Rate:  [64-81] 76 (03/14 0529) Resp:  [14-21] 18 (03/14 0529) BP: (99-117)/(49-85) 105/69 (03/14 0529) SpO2:  [95 %-98 %] 97 % (03/14 0529)  Physical Exam:  General: alert, cooperative, and appears stated age Lochia: appropriate Uterine Fundus: firm Incision: healing well, no significant drainage, no dehiscence.  Pressure dressing removed. DVT Evaluation: No evidence of DVT seen on physical exam. Negative Homan's sign. No cords or calf tenderness.  Recent Labs    04/18/23 0906 04/21/23 0610  HGB 11.4* 9.1*  HCT 34.5* 27.9*    Assessment/Plan: Status post Cesarean section. Doing well postoperatively.  Continue current care. Circ-patient is counseled re: risk of bleeding, infection and scarring.  All questions were answered and patient wishes to proceed.  Mitchel Honour, DO 04/21/2023, 8:59 AM

## 2023-04-22 LAB — BIRTH TISSUE RECOVERY COLLECTION (PLACENTA DONATION)

## 2023-04-22 MED ORDER — OXYCODONE HCL 5 MG PO TABS: 5.0000 mg | ORAL_TABLET | ORAL | 0 refills | Status: AC | PRN

## 2023-04-22 MED ORDER — IBUPROFEN 600 MG PO TABS: 600.0000 mg | ORAL_TABLET | Freq: Four times a day (QID) | ORAL | 0 refills | Status: DC

## 2023-04-22 NOTE — Lactation Note (Signed)
 This note was copied from a baby's chart. Lactation Consultation Note  Patient Name: Jeanne [name] [id number] Date: 04/22/2023 Age:24 hours Reason for consult: Follow-up assessment;Maternal endocrine disorder;Term;Infant weight loss;Breastfeeding assistance (8 % weight loss) Baby wide awake, LC changed a wet. Baby latched with depth, football. Swallows and per mom comfortable. Per mom feels like her milk is coming in. LC recommended due to the 8 % weight loss is every feeding offer the 2nd breast to enhance more volume more per feeding.  LC reviewed breast feeding D/C teaching and the [facility] resources.    Maternal Data Has patient been taught Hand Expression?: Yes  Feeding Mother's Current Feeding Choice: Breast Milk  LATCH Score Latch: Grasps breast easily, tongue down, lips flanged, rhythmical sucking.  Audible Swallowing: Spontaneous and intermittent  Type of Nipple: Everted at rest and after stimulation  Comfort (Breast/Nipple): Filling, red/small blisters or bruises, mild/mod discomfort  Hold (Positioning): Assistance needed to correctly position infant at breast and maintain latch.  LATCH Score: 8   Lactation Tools Discussed/Used  Per mom has a hand pump and DEBP at home   Interventions Interventions: Breast feeding basics reviewed;Assisted with latch;Skin to skin;Breast massage;Breast compression;Support pillows;Education;LC Services brochure;CDC milk storage guidelines;CDC Guidelines for Breast Pump Cleaning  Discharge Discharge Education: Engorgement and breast care;Warning signs for feeding baby Pump: Personal;DEBP;Manual;Hands Free  Consult Status Consult Status: Complete Date: 04/22/23    Kathrin Greathouse 04/22/2023, 2:06 PM

## 2023-04-22 NOTE — Discharge Summary (Signed)
 Postpartum Discharge Summary  Patient Name: Jeanne [name] DOB: 2000-12-16 MRN: [id number]  Date of admission: 04/20/2023 Delivery date:04/20/2023 Delivering provider: Ranae Pila Date of discharge: 04/22/2023  Admitting diagnosis: Pregnancy [Z34.90] Intrauterine pregnancy: [redacted]w[redacted]d     Secondary diagnosis:  Principal Problem:   Pregnancy  Additional problems: Previous C/S, A2DM    Discharge diagnosis: Term Pregnancy Delivered                                              Post partum procedures: none Augmentation: N/A Complications: None  Hospital course: Sceduled C/S   23 y.o. yo [id number] at [redacted]w[redacted]d was admitted to the hospital 04/20/2023 for scheduled cesarean section with the following indication:Elective Repeat and Malpresentation.Delivery details are as follows:  Membrane Rupture Time/Date: 8:11 AM,04/20/2023  Delivery Method:C-Section, Low Transverse Operative Delivery:N/A Details of operation can be found in separate operative note.  Patient had a postpartum course complicated by n/a.  She is ambulating, tolerating a regular diet, passing flatus, and urinating well. Patient is discharged home in stable condition on  04/22/23        Newborn Data: Birth date:04/20/2023 Birth time:8:11 AM Gender:Female Living status:Living Apgars:9 ,9  Weight:4140 g    Magnesium Sulfate received: No BMZ received: No Rhophylac:No MMR:Yes T-DaP:Given prenatally Flu: No RSV Vaccine received: No Transfusion:No  Immunizations received: Immunization History  Administered Date(s) Administered   Tdap 06/01/2021    Physical exam  Vitals:   04/21/23 1325 04/21/23 1700 04/21/23 2156 04/22/23 0529  BP: 107/62 120/68 123/73 112/77  Pulse: 78 74 80 67  Resp: 18 20 18 18   Temp: 98 F (36.7 C) 97.8 F (36.6 C) 98.2 F (36.8 C) 98 F (36.7 C)  TempSrc: Oral Oral Oral Oral  SpO2: 98% 99% 97% 97%  Weight:      Height:       General: alert, cooperative, and no distress Lochia:  appropriate Uterine Fundus: firm Incision: Healing well with no significant drainage, No significant erythema, Dressing is clean, dry, and intact DVT Evaluation: No evidence of DVT seen on physical exam. Negative Homan's sign. No cords or calf tenderness. Labs: Lab Results  Component Value Date   WBC 15.2 (H) 04/21/2023   HGB 9.1 (L) 04/21/2023   HCT 27.9 (L) 04/21/2023   MCV 79.7 (L) 04/21/2023   PLT 320 04/21/2023      Latest Ref Rng & Units 10/05/2022    9:07 AM  CMP  Glucose 70 - 99 mg/dL 89   BUN 6 - 23 mg/dL 7   Creatinine 3.08 - 6.57 mg/dL 8.46   Sodium 962 - 952 mEq/L 137   Potassium 3.5 - 5.1 mEq/L 4.2   Chloride 96 - 112 mEq/L 103   CO2 19 - 32 mEq/L 26   Calcium 8.4 - 10.5 mg/dL 9.5   Total Protein 6.0 - 8.3 g/dL 6.5   Total Bilirubin 0.2 - 1.2 mg/dL 0.3   Alkaline Phos 39 - 117 U/L 80   AST 0 - 37 U/L 15   ALT 0 - 35 U/L 17    Edinburgh Score:    04/21/2023    6:39 AM  Edinburgh Postnatal Depression Scale Screening Tool  I have been able to laugh and see the funny side of things. 0  I have looked forward with enjoyment to things. 0  I have blamed myself unnecessarily  when things went wrong. 1  I have been anxious or worried for no good reason. 1  I have felt scared or panicky for no good reason. 0  Things have been getting on top of me. 0  I have been so unhappy that I have had difficulty sleeping. 0  I have felt sad or miserable. 0  I have been so unhappy that I have been crying. 0  The thought of harming myself has occurred to me. 0  Edinburgh Postnatal Depression Scale Total 2   Edinburgh Postnatal Depression Scale Total: 2   After visit meds:  Allergies as of 04/22/2023       Reactions   Strawberry (diagnostic) Rash        Medication List     STOP taking these medications    glyBURIDE 2.5 MG tablet Commonly known as: DIABETA       TAKE these medications    ferrous sulfate 325 (65 FE) MG tablet Take 325 mg by mouth daily.    ibuprofen 600 MG tablet Commonly known as: ADVIL Take 1 tablet (600 mg total) by mouth every 6 (six) hours.   oxyCODONE 5 MG immediate release tablet Commonly known as: Oxy IR/ROXICODONE Take 1 tablet (5 mg total) by mouth every 4 (four) hours as needed for up to 7 days for moderate pain (pain score 4-6).   prenatal multivitamin Tabs tablet Take 1 tablet by mouth daily.         Discharge home in stable condition Infant Feeding: Breast Infant Disposition:home with mother Discharge instruction: per After Visit Summary and Postpartum booklet. Activity: Advance as tolerated. Pelvic rest for 6 weeks.  Diet: routine diet Future Appointments: Future Appointments  Date Time Provider Department Center  10/06/2023  8:00 AM Clayborne Dana, NP [facility]   Follow up Visit: 6 weeks [facility]  04/22/2023 Mitchel Honour, DO

## 2023-04-22 NOTE — Discharge Instructions (Signed)
 Call MD for T>100.4, heavy vaginal bleeding, severe abdominal pain, intractable nausea and/or vomiting, or respiratory distress.  Call office to schedule postpartum visit in 6 weeks.  Pelvic rest x 6 weeks.  No driving while taking narcotics.

## 2023-04-22 NOTE — Progress Notes (Signed)
 Subjective: Postpartum Day 2: Cesarean Delivery Patient reports tolerating PO, + flatus, + BM, and no problems voiding.    Objective: Vital signs in last 24 hours: Temp:  [97.8 F (36.6 C)-98.2 F (36.8 C)] 98 F (36.7 C) (03/15 0529) Pulse Rate:  [67-80] 67 (03/15 0529) Resp:  [18-20] 18 (03/15 0529) BP: (107-123)/(62-77) 112/77 (03/15 0529) SpO2:  [97 %-99 %] 97 % (03/15 0529)  Physical Exam:  General: alert, cooperative, and appears stated age 23: appropriate Uterine Fundus: firm Incision: healing well, no significant drainage, no dehiscence DVT Evaluation: No evidence of DVT seen on physical exam. Negative Homan's sign. No cords or calf tenderness.  Recent Labs    04/21/23 0610  HGB 9.1*  HCT 27.9*    Assessment/Plan: Status post Cesarean section. Doing well postoperatively.  Discharge home with standard precautions and return to clinic in 4-6 weeks.  Mitchel Honour, DO 04/22/2023, 8:17 AM

## 2023-05-02 ENCOUNTER — Telehealth (HOSPITAL_COMMUNITY): Payer: Self-pay | Admitting: *Deleted

## 2023-05-02 NOTE — Telephone Encounter (Signed)
 05/02/2023  Name: Glendy Barsanti MRN: [id number] DOB: 08-24-2000  Reason for Call:  Transition of Care Hospital Discharge Call  Contact Status: Patient Contact Status: Message  Language assistant needed:          Follow-Up Questions:    Inocente Salles Postnatal Depression Scale:  In the Past 7 Days:    PHQ2-9 Depression Scale:     Discharge Follow-up:    Post-discharge interventions: NA  Salena Saner, RN 05/02/2023 11:27

## 2023-07-31 ENCOUNTER — Telehealth: Payer: Self-pay | Admitting: Family Medicine

## 2023-07-31 DIAGNOSIS — G47419 Narcolepsy without cataplexy: Secondary | ICD-10-CM

## 2023-07-31 NOTE — Telephone Encounter (Signed)
 Copied from CRM [id number]. Topic: Referral - Question >> Jul 31, 2023  3:19 PM Corin V wrote: Reason for CRM: Patient called about her sleep study referral. She had her baby and is trying to reschedule the study but is not getting a call back from the number she had been given to schedule. She has been calling [phone] and wants to know if there is an alternate number she can reach out to or if a new referral is needed since it's been 10 months.

## 2023-07-31 NOTE — Telephone Encounter (Signed)
 Will she need another visit first?

## 2023-08-02 NOTE — Addendum Note (Signed)
 Addended by: ALMARIE BIRMINGHAM B on: 08/02/2023 12:58 PM   Modules accepted: Orders

## 2023-10-05 NOTE — Progress Notes (Unsigned)
 Complete physical exam  Patient: Jeanne [name]   DOB: Nov 29, 2000   22 y.o. Female  MRN: [id number]  Subjective:    No chief complaint on file.   Jeanne [name] is a 23 y.o. female who presents today for a complete physical exam. She reports consuming a {diet types:17450} diet. {types:19826} She generally feels {DESC; WELL/FAIRLY WELL/POORLY:18703}. She reports sleeping {DESC; WELL/FAIRLY WELL/POORLY:18703}. She {does/does not:200015} have additional problems to discuss today.   Currently lives with: *** Acute concerns or interim problems since last visit: ***  Vision concerns: *** Dental concerns: *** STD concerns: ***  ETOH use: *** Nicotine use: *** Recreational drugs/illegal substances: ***  Lung Cancer Screening with low-dose Chest CT: *** - Adults age 41-80 who are current cigarette smokers or quit within the last 15 years. Must have 20 pack year history.  AAA Screening: *** - Men age 83-75 who have ever smoked PSA: - (ages 30-69)? The natural history of prostate cancer and ongoing controversy regarding screening and potential treatment outcomes of prostate cancer has been discussed with the patient. The meaning of a false positive PSA and a false negative PSA has been discussed. He indicates understanding of the limitations of this screening test and wishes *** to proceed with screening PSA testing.  Females:  She is {Blank single:19197::currently,not currently,never}  sexually active  Contraception choices are: *** LMP: ***      Most recent fall risk assessment:    10/05/2022    9:17 AM  Fall Risk   Falls in the past year? 0  Number falls in past yr: 0  Injury with Fall? 0  Risk for fall due to : No Fall Risks  Follow up Falls evaluation completed     Most recent depression screenings:    10/05/2022    9:18 AM 06/18/2021   12:18 PM  PHQ 2/9 Scores  PHQ - 2 Score 0 0  PHQ- 9 Score 3           {History  (Optional):23778}  Patient Care Team: Almarie Waddell NOVAK, NP as PCP - General (Family Medicine)   Outpatient Medications Prior to Visit  Medication Sig   levonorgestrel (MIRENA, 52 MG,) 20 MCG/DAY IUD 1 each by Intrauterine route once.   ferrous sulfate 325 (65 FE) MG tablet Take 325 mg by mouth daily.   ibuprofen  (ADVIL ) 600 MG tablet Take 1 tablet (600 mg total) by mouth every 6 (six) hours.   Prenatal Vit-Fe Fumarate-FA (PRENATAL MULTIVITAMIN) TABS tablet Take 1 tablet by mouth daily.   No facility-administered medications prior to visit.    ROS        Objective:     There were no vitals taken for this visit. {Vitals History (Optional):23777}  Physical Exam   No results found for any visits on 10/06/23. {Show previous labs (optional):23779}    Assessment & Plan:    Routine Health Maintenance and Physical Exam Discussed health promotion and safety including diet and exercise recommendations, dental health, and injury prevention. Tobacco cessation if applicable. Seat belts, sunscreen, smoke detectors, etc.    Immunization History  Administered Date(s) Administered   Influenza, Mdck, Trivalent,PF 6+ MOS(egg free) 12/26/2022   MMR 04/22/2023   Tdap 06/01/2021, 01/25/2023    Health Maintenance  Topic Date Due   HPV VACCINES (1 - 3-dose series) Never done   Meningococcal B Vaccine (1 of 2 - Standard) Never done   Pneumococcal Vaccine (1 of 2 - PCV) Never done   Hepatitis B Vaccines  19-59 Average Risk (1 of 3 - 19+ 3-dose series) Never done   CHLAMYDIA SCREENING  09/29/2023   COVID-19 Vaccine (1) 10/21/2023 (Originally 12/05/2004)   INFLUENZA VACCINE  05/07/2024 (Originally 09/08/2023)   Cervical Cancer Screening (Pap smear)  01/26/2024   DTaP/Tdap/Td (3 - Td or Tdap) 01/24/2033   Hepatitis C Screening  Completed   HIV Screening  Completed        Problem List Items Addressed This Visit   None Visit Diagnoses       Annual physical exam    -  Primary           PATIENT COUNSELING:   Advised to take 1 mg of folate supplement per day if capable of pregnancy. ***  Encouraged smoking cessation. ***  Recommend that most people either abstain from alcohol or drink within safe limits (<=14/week and <=4 drinks/occasion for males, <=7/weeks and <= 3 drinks/occasion for females) and that the risk for alcohol disorders and other health effects rises proportionally with the number of drinks per week and how often a drinker exceeds daily limits.   Diet: Recommend to adjust caloric intake to maintain or achieve ideal body weight, to reduce intake of dietary saturated fat and total fat, to limit sodium intake by avoiding high sodium foods and not adding table salt, and to maintain adequate dietary potassium and calcium preferably from fresh fruits, vegetables, and low-fat dairy products.   Emphasized the importance of regular exercise.  Injury prevention: Recommend seatbelts, safety helmets, smoke detector, etc..   Dental health: Recommend regular tooth brushing, flossing, and dental visits.       No follow-ups on file.     Waddell KATHEE Mon, NP

## 2023-10-06 ENCOUNTER — Ambulatory Visit (INDEPENDENT_AMBULATORY_CARE_PROVIDER_SITE_OTHER): Payer: 59 | Admitting: Family Medicine

## 2023-10-06 ENCOUNTER — Encounter: Payer: Self-pay | Admitting: Family Medicine

## 2023-10-06 VITALS — BP 114/55 | HR 79 | Ht 64.0 in | Wt 212.0 lb

## 2023-10-06 DIAGNOSIS — G47419 Narcolepsy without cataplexy: Secondary | ICD-10-CM | POA: Diagnosis not present

## 2023-10-06 DIAGNOSIS — Z0000 Encounter for general adult medical examination without abnormal findings: Secondary | ICD-10-CM

## 2023-10-06 DIAGNOSIS — Z131 Encounter for screening for diabetes mellitus: Secondary | ICD-10-CM

## 2023-10-06 DIAGNOSIS — D509 Iron deficiency anemia, unspecified: Secondary | ICD-10-CM

## 2023-10-06 DIAGNOSIS — Z0279 Encounter for issue of other medical certificate: Secondary | ICD-10-CM

## 2023-10-06 LAB — CBC WITH DIFFERENTIAL/PLATELET
Basophils Absolute: 0.1 K/uL (ref 0.0–0.1)
Basophils Relative: 1 % (ref 0.0–3.0)
Eosinophils Absolute: 0.1 K/uL (ref 0.0–0.7)
Eosinophils Relative: 1.5 % (ref 0.0–5.0)
HCT: 37.7 % (ref 36.0–46.0)
Hemoglobin: 12 g/dL (ref 12.0–15.0)
Lymphocytes Relative: 39.6 % (ref 12.0–46.0)
Lymphs Abs: 2.8 K/uL (ref 0.7–4.0)
MCHC: 31.8 g/dL (ref 30.0–36.0)
MCV: 78.4 fl (ref 78.0–100.0)
Monocytes Absolute: 0.5 K/uL (ref 0.1–1.0)
Monocytes Relative: 7.8 % (ref 3.0–12.0)
Neutro Abs: 3.5 K/uL (ref 1.4–7.7)
Neutrophils Relative %: 50.1 % (ref 43.0–77.0)
Platelets: 444 K/uL — ABNORMAL HIGH (ref 150.0–400.0)
RBC: 4.81 Mil/uL (ref 3.87–5.11)
RDW: 15.2 % (ref 11.5–15.5)
WBC: 7 K/uL (ref 4.0–10.5)

## 2023-10-06 LAB — COMPREHENSIVE METABOLIC PANEL WITH GFR
ALT: 20 U/L (ref 0–35)
AST: 16 U/L (ref 0–37)
Albumin: 4.3 g/dL (ref 3.5–5.2)
Alkaline Phosphatase: 80 U/L (ref 39–117)
BUN: 13 mg/dL (ref 6–23)
CO2: 24 meq/L (ref 19–32)
Calcium: 9.1 mg/dL (ref 8.4–10.5)
Chloride: 104 meq/L (ref 96–112)
Creatinine, Ser: 0.64 mg/dL (ref 0.40–1.20)
GFR: 124.21 mL/min (ref >60.00)
Glucose, Bld: 95 mg/dL (ref 70–99)
Potassium: 4.3 meq/L (ref 3.5–5.1)
Sodium: 138 meq/L (ref 135–145)
Total Bilirubin: 0.4 mg/dL (ref 0.2–1.2)
Total Protein: 7 g/dL (ref 6.0–8.3)

## 2023-10-06 LAB — LIPID PANEL
Cholesterol: 187 mg/dL (ref 0–200)
HDL: 47.9 mg/dL (ref >39.00)
LDL Cholesterol: 111 mg/dL — ABNORMAL HIGH (ref 0–99)
NonHDL: 138.98
Total CHOL/HDL Ratio: 4
Triglycerides: 140 mg/dL (ref 0.0–149.0)
VLDL: 28 mg/dL (ref 0.0–40.0)

## 2023-10-06 LAB — IBC + FERRITIN
Ferritin: 17.1 ng/mL (ref 10.0–291.0)
Iron: 63 ug/dL (ref 42–145)
Saturation Ratios: 14.8 % — ABNORMAL LOW (ref 20.0–50.0)
TIBC: 425.6 ug/dL (ref 250.0–450.0)
Transferrin: 304 mg/dL (ref 212.0–360.0)

## 2023-10-06 LAB — TSH: TSH: 1.06 u[IU]/mL (ref 0.35–5.50)

## 2023-10-06 LAB — HEMOGLOBIN A1C: Hgb A1c MFr Bld: 6.2 % (ref 4.6–6.5)

## 2023-10-06 NOTE — Assessment & Plan Note (Signed)
 Needs new referral and likely sleep study now that she is no longer pregnant

## 2023-10-11 ENCOUNTER — Ambulatory Visit: Payer: Self-pay | Admitting: Family Medicine

## 2023-11-01 ENCOUNTER — Telehealth: Payer: Self-pay | Admitting: Neurology

## 2023-11-01 NOTE — Telephone Encounter (Signed)
 Patient left forms at the front for completion. Forms completed, copy sent to scan, copy to hold. Placed at front desk for patient to pick up, LVM on patient's machine to let her know. Also, faxed form with confirmation received.

## 2023-11-14 ENCOUNTER — Encounter: Payer: Self-pay | Admitting: Neurology

## 2023-11-14 ENCOUNTER — Ambulatory Visit: Admitting: Neurology

## 2023-11-14 VITALS — BP 114/67 | HR 76 | Ht 64.0 in | Wt 214.0 lb

## 2023-11-14 DIAGNOSIS — G47411 Narcolepsy with cataplexy: Secondary | ICD-10-CM

## 2023-11-14 DIAGNOSIS — E669 Obesity, unspecified: Secondary | ICD-10-CM

## 2023-11-14 DIAGNOSIS — G4719 Other hypersomnia: Secondary | ICD-10-CM | POA: Diagnosis not present

## 2023-11-14 DIAGNOSIS — R0683 Snoring: Secondary | ICD-10-CM | POA: Diagnosis not present

## 2023-11-14 NOTE — Progress Notes (Signed)
 Subjective:    Patient ID: Jeanne [name] is a 23 y.o. female.  HPI    True Mar, MD, PhD [facility] [address], 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  The patient is accompanied by her husband and 2 children today.  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 She reports she had a sleep specialist in [place], when she still lived with her parents.  She recalls having a nighttime and a daytime sleep study.  She was tried on different medications but recalls Adderall long-acting and taking hydroxyzine at night for sleep as Adderall caused insomnia.  Prior to that she tried Xyrem in or around 2018 or 19.  She stopped Xyrem in 2020 and she stopped her other medications in 2021.  She denies any recent cataplexy, feels that it is rare, she has not had any sleep paralysis in the past few years.  She lives with her husband and 2 children.  She drinks caffeine in the form of iced coffee and 1 soda bottle per day, occasional energy drink.  She drinks alcohol about once every 2 months.  She is a non-smoker.  She does not currently work.  She does not drive, currently does not have a license.  Epworth sleepiness score is 14 out of 24.  Prior sleep test results are not available for my review today.  Prior sleep specialist encounters are not available for my review today.  I reviewed your office note from 10/05/2022. She does not report a family history of narcolepsy or sleep apnea.  She snores occasionally and mildly per husband.  She is currently not on any antidepressant or anxiety medication, no over-the-counter sleep aid or  prescription sleep aid and no medication for her narcolepsy in years.  Her Past Medical History Is Significant For: Past Medical History:  Diagnosis Date   Anemia    Asthma    Narcolepsy     Her Past Surgical History Is Significant For: Past Surgical History:  Procedure Laterality Date   CESAREAN SECTION N/A 08/13/2021   Procedure: CESAREAN SECTION;  Surgeon: Mat Browning, MD;  Location: [facility] LD ORS;  Service: Obstetrics;  Laterality: N/A;   CESAREAN SECTION N/A 04/20/2023   Procedure: REPEAT CESAREAN SECTION EDC: 04-26-23 ALLERG: NKDA PREVIOUS X 1;  Surgeon: Marne Kelly Nest, MD;  Location: [facility] LD ORS;  Service: Obstetrics;  Laterality: N/A;   WISDOM TOOTH EXTRACTION      Her Family History Is Significant For: Family History  Problem Relation Age of Onset   Schizophrenia Brother    Narcolepsy Neg Hx     Her Social History Is Significant For: Social History   Socioeconomic History   Marital status: Married    Spouse name: Orthoptist   Number of children: Not on file   Years of education: Not on file   Highest education level: Not on file  Occupational History   Not on file  Tobacco Use   Smoking status: Never   Smokeless tobacco: Never  Vaping Use   Vaping status: Never Used  Substance and Sexual Activity   Alcohol use: Yes    Comment: occ  Drug use: Never   Sexual activity: Yes  Other Topics Concern   Not on file  Social History Narrative   Married - lives in [place]    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    Social Drivers of Health   Financial Resource Strain: Not on file  Food Insecurity: No Food Insecurity (04/20/2023)   Hunger Vital Sign    Worried About Running Out of Food in the Last Year: Never true    Ran Out of Food in the Last Year: Never true  Transportation Needs: No Transportation Needs (04/20/2023)   PRAPARE - Administrator, Civil Service (Medical): No    Lack of Transportation (Non-Medical): No  Physical Activity: Not on file  Stress: Not on file   Social Connections: Socially Integrated (04/20/2023)   Social Connection and Isolation Panel    Frequency of Communication with Friends and Family: More than three times a week    Frequency of Social Gatherings with Friends and Family: Twice a week    Attends Religious Services: More than 4 times per year    Active Member of [employer] or Organizations: Yes    Attends Engineer, structural: More than 4 times per year    Marital Status: Married    Her Allergies Are:  Allergies  Allergen Reactions   Strawberry (Diagnostic) Rash  :   Her Current Medications Are:  Outpatient Encounter Medications as of 11/14/2023  Medication Sig   ferrous sulfate 325 (65 FE) MG tablet Take 325 mg by mouth daily.   levonorgestrel (MIRENA, 52 MG,) 20 MCG/DAY IUD 1 each by Intrauterine route once.   No facility-administered encounter medications on file as of 11/14/2023.  :   Review of Systems:  Out of a complete 14 point review of systems, all are reviewed and negative with the exception of these symptoms as listed below:    Review of Systems  Neurological:        Pt here for narcolepsy    ESS:14     Objective:  Neurological Exam  Physical Exam Physical Examination:   Vitals:   11/14/23 0939  BP: 114/67  Pulse: 76    General Examination: The patient is a very pleasant 23 y.o. female in no acute distress. She appears well-developed and well-nourished and well groomed.   HEENT: Normocephalic, atraumatic, pupils are equal, round and reactive to light, extraocular tracking is good without limitation to gaze excursion or nystagmus noted. No photophobia.  Hearing is grossly intact.  Face is symmetric with normal facial animation. Speech is clear without dysarthria. There is no hypophonia. There is no lip, neck/head, jaw or voice tremor. Neck is supple with full range of passive and active motion. There are no carotid bruits on auscultation.  Airway/Oropharynx exam reveals: mild mouth  dryness, good dental hygiene and mild airway crowding. Tongue protrudes centrally and palate elevates symmetrically. Tonsils are small.  Chest: Clear to auscultation without wheezing, rhonchi or crackles noted.  Heart: S1+S2+0, regular and normal without murmurs, rubs or gallops noted.   Abdomen: Soft, non-tender and non-distended.  Extremities: There is no pitting edema in the distal lower extremities bilaterally.   Skin: Warm and dry without trophic changes noted.   Musculoskeletal: exam reveals no obvious joint deformities.   Neurologically:  Mental status: The patient is awake, alert and oriented in all 4 spheres. Her immediate and remote memory, attention, language skills and fund of knowledge are appropriate. There is no evidence of aphasia, agnosia, apraxia or anomia. Speech  is clear with normal prosody and enunciation. Thought process is linear. Mood is normal and affect is normal.  Cranial nerves II - XII are as described above under HEENT exam.  Motor exam: Normal bulk, strength and tone is noted. There is no obvious action or resting tremor.  Fine motor skills and coordination: Intact grossly.  Cerebellar testing: No dysmetria or intention tremor. There is no truncal or gait ataxia.  Sensory exam: intact to light touch in the upper and lower extremities.  Gait, station and balance: She stands easily. No veering to one side is noted. No leaning to one side is noted. Posture is age-appropriate and stance is narrow based. Gait shows normal stride length and normal pace. No problems turning are noted.   Assessment and Plan:  In summary, Jeanne [name] is a very pleasant 23 y.o.-year old female with an underlying medical history of anemia, asthma, and obesity, who presents for evaluation of her sleep disorder, in particular, her prior diagnosis of narcolepsy with cataplexy.  We will proceed with sleep testing to confirm the diagnosis.  She is agreeable to proceeding.  I explained  the sleep study procedures to the patient.  She currently does not breast-feed and is okay pursuing a nighttime and daytime sleep study, does not have to wean off any medication as she does not take any prescription anxiety or depression medication, no prescription medicines for her narcolepsy and no sleep aid.   I had a long chat with the patient and her husband about the diagnosis of narcolepsy.  Should her overnight sleep study determined that she has obstructive sleep apnea, we will proceed with treatment of her sleep apnea.  She is strongly advised not to drive or be at heights, or hold her infant or young child when feeling sleepy and always sit down when she is holding her children.   She demonstrated understanding and agreement.  We will plan a follow-up after testing.  She is requested to try to get records from her sleep specialist in [place] , she was agreeable to trying to request records from them. I answered all their questions today and the patient and her husband were in agreement.  Thank you very much for allowing me to participate in the care of this nice patient. If I can be of any further assistance to you please do not hesitate to call me at [phone].  Sincerely,   True Mar, MD, PhD

## 2023-12-27 ENCOUNTER — Ambulatory Visit: Admitting: Neurology

## 2023-12-27 DIAGNOSIS — G4720 Circadian rhythm sleep disorder, unspecified type: Secondary | ICD-10-CM

## 2023-12-27 DIAGNOSIS — G47411 Narcolepsy with cataplexy: Secondary | ICD-10-CM

## 2023-12-27 DIAGNOSIS — E669 Obesity, unspecified: Secondary | ICD-10-CM

## 2023-12-27 DIAGNOSIS — R0683 Snoring: Secondary | ICD-10-CM

## 2023-12-27 DIAGNOSIS — G47419 Narcolepsy without cataplexy: Secondary | ICD-10-CM

## 2023-12-27 DIAGNOSIS — G4719 Other hypersomnia: Secondary | ICD-10-CM

## 2023-12-28 ENCOUNTER — Other Ambulatory Visit: Payer: Self-pay | Admitting: *Deleted

## 2023-12-28 ENCOUNTER — Ambulatory Visit: Admitting: Neurology

## 2023-12-28 ENCOUNTER — Other Ambulatory Visit: Payer: Self-pay

## 2023-12-28 DIAGNOSIS — G47411 Narcolepsy with cataplexy: Secondary | ICD-10-CM

## 2023-12-28 DIAGNOSIS — G4719 Other hypersomnia: Secondary | ICD-10-CM

## 2023-12-28 DIAGNOSIS — G4710 Hypersomnia, unspecified: Secondary | ICD-10-CM | POA: Diagnosis not present

## 2023-12-28 DIAGNOSIS — E669 Obesity, unspecified: Secondary | ICD-10-CM

## 2023-12-28 DIAGNOSIS — R0683 Snoring: Secondary | ICD-10-CM

## 2023-12-31 LAB — COMPREHENSIVE DRUG ANALYSIS,UR

## 2024-01-08 ENCOUNTER — Ambulatory Visit: Payer: Self-pay | Admitting: Neurology

## 2024-01-08 NOTE — Procedures (Signed)
 Physician Interpretation: Please see link under Procedure Tab or under Encounters tab for physician report, technical report, as well as O2 titration and/or PAP titration tables (if applicable).

## 2024-01-12 NOTE — Telephone Encounter (Signed)
 Pt is asking to be called back with results, pt informed may be a call from a private number.

## 2024-01-12 NOTE — Telephone Encounter (Signed)
-----   Message from True Mar sent at 01/08/2024  5:01 PM EST ----- Patient referred by PCP for re-eval of her prior Dx of narcolepsy, seen by me on 11/14/23, diagnostic PSG on 12/27/23, MSLT on 12/28/23.   Please call and notify the patient that the recent sleep study and next day nap study did show significant sleepiness, in keeping with narcolepsy. Please inform patient that I would like to go over  the details of the study and treatment options during a follow up appointment. Arrange a followup appointment. Also, route or fax report to referring provider.   Thanks,  True Mar, MD, PhD [facility])   ----- Message ----- From: Mar True, MD Sent: 01/08/2024   4:59 PM EST To: True Mar, MD

## 2024-01-12 NOTE — Telephone Encounter (Signed)
 Lvm 1st attempt by hf 01/12/24

## 2024-01-16 NOTE — Telephone Encounter (Signed)
 Pt called returning call , Inform Pt that she will get a call back

## 2024-01-17 NOTE — Telephone Encounter (Signed)
-----   Message from CMA [place] F sent at 01/15/2024  7:35 AM EST -----  ----- Message ----- From: Cecily Olam HERO Sent: 01/12/2024   9:44 AM EST To: Nevelyn Meissner, CMA  ----- Message from Olam HERO Cecily sent at 01/12/2024  9:44 AM EST -----

## 2024-01-17 NOTE — Telephone Encounter (Addendum)
 Spoke to Patient gave PSG,MSLT  results Per Dr Buck made f/u visit 02/2024 Pt expressed understanding and thanked me for calling . Forward results to PCP

## 2024-02-19 ENCOUNTER — Ambulatory Visit: Admitting: Neurology

## 2024-02-19 ENCOUNTER — Encounter: Payer: Self-pay | Admitting: Neurology

## 2024-02-19 VITALS — BP 116/67 | HR 88 | Ht 64.0 in | Wt 215.6 lb

## 2024-02-19 DIAGNOSIS — G47411 Narcolepsy with cataplexy: Secondary | ICD-10-CM

## 2024-02-19 MED ORDER — MODAFINIL 100 MG PO TABS: 100.0000 mg | ORAL_TABLET | Freq: Two times a day (BID) | ORAL | 5 refills | Status: AC

## 2024-02-19 NOTE — Patient Instructions (Signed)
 We will start you on Provigil  100 mg: 1 pill up to 2 times a day as needed. Avoid after 3 PM. Side effects include (but are not limited to): high blood pressure, headache, nervousness, palpitations, GI upset, tremor.

## 2024-02-19 NOTE — Progress Notes (Signed)
 Subjective:    Patient ID: Jeanne [name] is a 24 y.o. female.  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  The patient is unaccompanied today.  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  She was not on any symptomatic medications at the time.  She was advised to proceed with sleep testing.  She had a nocturnal polysomnogram with subsequent next-day nap testing on 12/27/2023 and 12/28/2023 respectively and results were supportive of narcolepsy with no significant sleep disordered breathing noted during the overnight sleep study.  She had a mean sleep latency of 6.75 minutes during the nap study with 2 REM onset naps. A UDS from 12/28/2023 was negative.  Today, 02/19/2024: She reports feeling about the same, no new symptoms. Some nights sleep is more interrupted because her 60-month-old does not sleep through the night but he is become better.  She has rare instances of cataplexy like symptoms when she is anxious or with strong emotion, overall symptoms have improved in that regard.  She is interested in starting medications for her narcolepsy.  She is hoping to get a license soon.  She does drink caffeine in the form of soda, 1 can or 1 bottle per day on average, today she is having a red bull but she does not have an energy drink every day.  She reports that she did not sleep well at night.  She tries to hydrate well but is somewhat forgetful about water  intake she admits.  She has not tried Provigil  before as far she knows. She has a 77-1/2-year-old and a 73-month-old.  She is currently not planning to get pregnant.  She is currently not breast-feeding. Her Epworth sleepiness score is 15 out of 24 today.  The  patient's allergies, current medications, family history, past medical history, past social history, past surgical history and problem list were reviewed and updated as appropriate.   Previously:  11/14/2023: (She) reports a prior diagnosis of narcolepsy when she was a teenager. She reports she had a sleep specialist in [place], when she still lived with her parents.  She recalls having a nighttime and a daytime sleep study.  She was tried on different medications but recalls Adderall long-acting and taking hydroxyzine at night for sleep as Adderall caused insomnia.  Prior to that she tried Xyrem in or around 2018 or 18.  She stopped Xyrem in 2020 and she stopped her other medications in 2021.  She denies any recent cataplexy, feels that it is rare, she has not had any sleep paralysis in the past few years.  She lives with her husband and 2 children.  She drinks caffeine in the form of iced coffee and 1 soda bottle per day, occasional energy drink.  She drinks alcohol about once every 2 months.  She is a non-smoker.  She does not currently work.  She does not drive, currently does not have a license.  Epworth sleepiness score is 14 out of 24.  Prior sleep test results are not available for my review today.  Prior sleep specialist encounters are not available for my review today.  I reviewed your office note from 10/05/2022. She does not report a family history of narcolepsy or sleep apnea.  She snores  occasionally and mildly per husband.  She is currently not on any antidepressant or anxiety medication, no over-the-counter sleep aid or prescription sleep aid and no medication for her narcolepsy in years.  Her Past Medical History Is Significant For: Past Medical History:  Diagnosis Date   Anemia    Asthma    Narcolepsy     Her Past Surgical History Is Significant For: Past Surgical History:  Procedure Laterality Date   CESAREAN SECTION N/A 08/13/2021   Procedure: CESAREAN SECTION;  Surgeon: Mat Browning, MD;  Location: [facility];  Service: Obstetrics;  Laterality: N/A;   CESAREAN SECTION N/A 04/20/2023   Procedure: REPEAT CESAREAN SECTION EDC: 04-26-23 ALLERG: NKDA PREVIOUS X 1;  Surgeon: Marne Kelly Nest, MD;  Location: [facility];  Service: Obstetrics;  Laterality: N/A;   WISDOM TOOTH EXTRACTION      Her Family History Is Significant For: Family History  Problem Relation Age of Onset   Schizophrenia Brother    Narcolepsy Neg Hx     Her Social History Is Significant For: Social History   Socioeconomic History   Marital status: Married    Spouse name: Orthoptist   Number of children: Not on file   Years of education: Not on file   Highest education level: Not on file  Occupational History   Not on file  Tobacco Use   Smoking status: Never   Smokeless tobacco: Never  Vaping Use   Vaping status: Never Used  Substance and Sexual Activity   Alcohol use: Yes    Comment: occ   Drug use: Never   Sexual activity: Yes  Other Topics Concern   Not on file  Social History Narrative   Married - lives in [place]    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    Social Drivers of Health   Tobacco Use: Low Risk (02/19/2024)   Patient History    Smoking Tobacco Use: Never    Smokeless Tobacco Use: Never    Passive Exposure: Not on file  Financial Resource Strain: Not on file  Food Insecurity: No Food Insecurity (04/20/2023)   Hunger Vital Sign    Worried About Running Out of Food in the Last Year: Never true    Ran Out of Food in the Last Year: Never true  Transportation Needs: No Transportation Needs (04/20/2023)   PRAPARE - Administrator, Civil Service (Medical): No    Lack of Transportation (Non-Medical): No  Physical Activity: Not on file  Stress: Not on file  Social Connections: Socially Integrated (04/20/2023)   Social Connection and Isolation Panel    Frequency of Communication with Friends and Family: More than three times a week    Frequency of Social Gatherings with Friends and  Family: Twice a week    Attends Religious Services: More than 4 times per year    Active Member of [employer] or Organizations: Yes    Attends Banker Meetings: More than 4 times per year    Marital Status: Married  Depression (PHQ2-9): Medium Risk (10/06/2023)   Depression (PHQ2-9)    PHQ-2 Score: 5  Alcohol Screen: Not on file  Housing: Unknown (04/20/2023)   Housing Stability Vital Sign    Unable to Pay for Housing in the Last Year: No    Number of Times Moved in the Last Year: Not on file    Homeless in the Last Year: No  Utilities: Not At Risk (04/20/2023)   [facility] Utilities    Threatened with  loss of utilities: No  Health Literacy: Not on file    Her Allergies Are:  Allergies[1]:   Her Current Medications Are:  Outpatient Encounter Medications as of 02/19/2024  Medication Sig   ferrous sulfate 325 (65 FE) MG tablet Take 325 mg by mouth daily.   levonorgestrel (MIRENA, 52 MG,) 20 MCG/DAY IUD 1 each by Intrauterine route once.   No facility-administered encounter medications on file as of 02/19/2024.  :  Review of Systems:  Out of a complete 14 point review of systems, all are reviewed and negative with the exception of these symptoms as listed below:  Review of Systems  Objective:  Neurological Exam  Physical Exam Physical Examination:   Vitals:   02/19/24 1243  BP: 116/67  Pulse: 88    General Examination: The patient is a very pleasant 24 y.o. female in no acute distress. She appears well-developed and well-nourished and well groomed.   HEENT: Normocephalic, atraumatic, pupils are equal, round and reactive to light, extraocular tracking is good without limitation to gaze excursion or nystagmus noted. No photophobia. Hearing is grossly intact.  Face is symmetric with normal facial animation. Speech is clear without dysarthria. There is no hypophonia. There is no lip, neck/head, jaw or voice tremor. Neck is supple with full range of passive and active motion.  There are no carotid bruits on auscultation.  Airway/Oropharynx exam reveals: mild mouth dryness, good dental hygiene and mild airway crowding. Tongue protrudes centrally and palate elevates symmetrically.  Altogether, stable findings.  Chest: Clear to auscultation without wheezing, rhonchi or crackles noted.   Heart: S1+S2+0, regular and normal without murmurs, rubs or gallops noted.    Abdomen: Soft, non-tender and non-distended.   Extremities: There is no obvious swelling in the distal lower extremities bilaterally.    Skin: Warm and dry without trophic changes noted.    Musculoskeletal: exam reveals no obvious joint deformities.    Neurologically:  Mental status: The patient is awake, alert and oriented in all 4 spheres. Her immediate and remote memory, attention, language skills and fund of knowledge are appropriate. There is no evidence of aphasia, agnosia, apraxia or anomia. Speech is clear with normal prosody and enunciation. Thought process is linear. Mood is normal and affect is normal.  Cranial nerves II - XII are as described above under HEENT exam.  Motor exam: Normal bulk, strength and tone is noted. There is no obvious action or resting tremor.  Fine motor skills and coordination: Intact grossly.  Cerebellar testing: No dysmetria or intention tremor. There is no truncal or gait ataxia.  Sensory exam: intact to light touch in the upper and lower extremities.  Gait, station and balance: She stands easily. No veering to one side is noted. No leaning to one side is noted. Posture is age-appropriate and stance is narrow based. Gait shows normal stride length and normal pace. No problems turning are noted.    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   She carries a prior diagnosis of narcolepsy when she was a teenager.  She had tried Adderall and Xyrem in the past.  She had a nocturnal polysomnogram with subsequent next-day nap testing on 12/27/2023 and 12/28/2023 respectively and results were supportive of narcolepsy with no significant sleep disordered breathing noted during the overnight sleep study.  She  had a mean sleep latency of 6.75 minutes during the nap study with 2 REM onset naps. A UDS from 12/28/2023 was negative. We talked about her test results today.  We talked about symptoms of narcolepsy and the importance of staying vigilant while driving.  She is pursuing her driver's license at this point.  We talked about symptomatic treatment options.  I would like to see if we can avoid a stimulant at this time.  I talked about expectations and side effects and limitations of stimulant medications.  She is advised at this point to start Provigil  generic 100 mg strength 1 pill up to twice daily.  We talked about side effects and limitation and expectation of this medication.  She can start with 1 pill in the morning around 930.  She usually gets up around 9 AM.  She can take a second dose around lunchtime, which is typically between 12:30 PM and 1.  She is advised to avoid Provigil  after 3 PM so do not get insomnia.  In the past, she had developed insomnia on Adderall and took hydroxyzine at bedtime.  I would rather avoid hydroxyzine or similar medications.  Cataplexy is currently not a big issue but we can certainly consider imipramine and low-dose or imipramine in the future.  She is advised not to drive when feeling sleepy.  Narcolepsy diagnosis should not prevent her from pursuing her driver's license.   However, she is encouraged to wait and see how well she is doing on her medication regimen.   She is advised to follow-up routinely in this clinic in 6 months to see one of our nurse practitioners.  She is reminded to stay well-hydrated and well  rested, make enough time for sleep, avoid caffeine especially when starting the Provigil .  I answered all her questions today and she was in agreement with our plan. I spent 30 minutes in total face-to-face time and in reviewing records during pre-charting, more than 50% of which was spent in counseling and coordination of care, reviewing test results, reviewing medications and treatment regimen and/or in discussing or reviewing the diagnosis of narcolepsy, the prognosis and treatment options. Pertinent laboratory and imaging test results that were available during this visit with the patient were reviewed by me and considered in my medical decision making (see chart for details).      [1]  Allergies Allergen Reactions   Strawberry (Diagnostic) Rash

## 2024-08-19 ENCOUNTER — Ambulatory Visit: Admitting: Neurology
